# Patient Record
Sex: Female | Born: 1980 | Hispanic: Yes | Smoking: Never smoker
Health system: Southern US, Community
[De-identification: ages and names within clinical notes are randomized; demographics above are authoritative.]

## PROBLEM LIST (undated history)

## (undated) DIAGNOSIS — B999 Unspecified infectious disease: Secondary | ICD-10-CM

## (undated) HISTORY — PX: CHOLECYSTECTOMY: SHX55

## (undated) HISTORY — PX: OOPHORECTOMY: SHX86

---

## 2005-12-17 ENCOUNTER — Ambulatory Visit: Payer: Self-pay | Admitting: *Deleted

## 2006-03-18 ENCOUNTER — Ambulatory Visit: Payer: Self-pay | Admitting: Obstetrics & Gynecology

## 2006-03-18 ENCOUNTER — Encounter (INDEPENDENT_AMBULATORY_CARE_PROVIDER_SITE_OTHER): Payer: Self-pay | Admitting: Specialist

## 2006-10-26 ENCOUNTER — Ambulatory Visit: Payer: Self-pay | Admitting: Obstetrics & Gynecology

## 2006-10-26 ENCOUNTER — Encounter: Payer: Self-pay | Admitting: Obstetrics & Gynecology

## 2007-04-26 ENCOUNTER — Ambulatory Visit: Payer: Self-pay | Admitting: Obstetrics & Gynecology

## 2007-04-26 ENCOUNTER — Encounter: Payer: Self-pay | Admitting: Obstetrics & Gynecology

## 2007-05-03 ENCOUNTER — Ambulatory Visit (HOSPITAL_COMMUNITY): Admission: RE | Admit: 2007-05-03 | Discharge: 2007-05-03 | Payer: Self-pay | Admitting: Obstetrics & Gynecology

## 2007-05-10 ENCOUNTER — Ambulatory Visit: Payer: Self-pay | Admitting: Obstetrics & Gynecology

## 2007-06-09 ENCOUNTER — Ambulatory Visit: Payer: Self-pay | Admitting: Obstetrics & Gynecology

## 2007-06-09 ENCOUNTER — Encounter: Payer: Self-pay | Admitting: Obstetrics & Gynecology

## 2007-06-09 ENCOUNTER — Ambulatory Visit (HOSPITAL_COMMUNITY): Admission: RE | Admit: 2007-06-09 | Discharge: 2007-06-09 | Payer: Self-pay | Admitting: Obstetrics & Gynecology

## 2007-06-21 ENCOUNTER — Ambulatory Visit: Payer: Self-pay | Admitting: Obstetrics & Gynecology

## 2007-08-23 ENCOUNTER — Inpatient Hospital Stay (HOSPITAL_COMMUNITY): Admission: AD | Admit: 2007-08-23 | Discharge: 2007-08-23 | Payer: Self-pay | Admitting: Obstetrics and Gynecology

## 2008-04-11 ENCOUNTER — Encounter: Payer: Self-pay | Admitting: Family Medicine

## 2008-04-11 ENCOUNTER — Ambulatory Visit: Payer: Self-pay | Admitting: Obstetrics and Gynecology

## 2008-04-16 ENCOUNTER — Ambulatory Visit (HOSPITAL_COMMUNITY): Admission: RE | Admit: 2008-04-16 | Discharge: 2008-04-16 | Payer: Self-pay | Admitting: Family Medicine

## 2008-05-02 ENCOUNTER — Ambulatory Visit: Payer: Self-pay | Admitting: Obstetrics and Gynecology

## 2008-08-28 IMAGING — US US TRANSVAGINAL NON-OB
1 series · 14 of 25 positions shown · non-contrast
Comparison: None.

CLINICAL DATA: Pelvic pain.  Right adnexal mass on physical exam.
TRANSABDOMINAL AND TRANSVAGINAL PELVIC ULTRASOUND:
TECHNIQUE: Both transabdominal and transvaginal ultrasound examinations of the pelvis were performed including evaluation of the uterus, ovaries, adnexal regions, and pelvic cul-de-sac.

[Series 1: us transvaginal non-ob · 0.30mm/px · 14 of 60 slices shown]
[im 1/60]
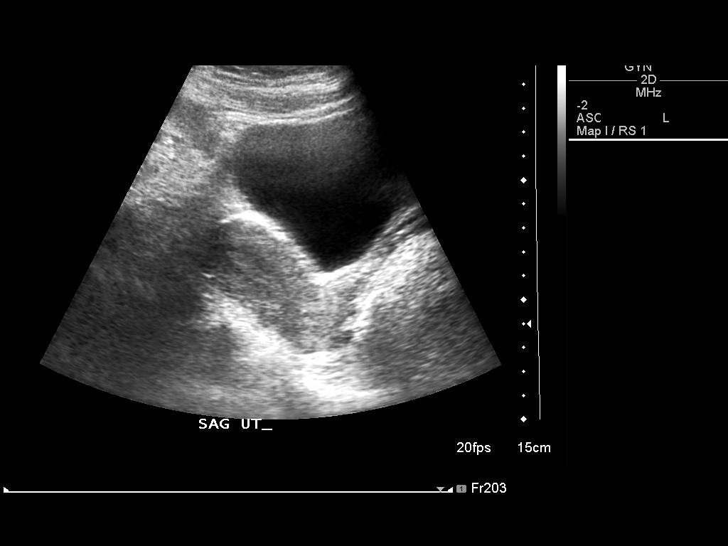
[im 5/60]
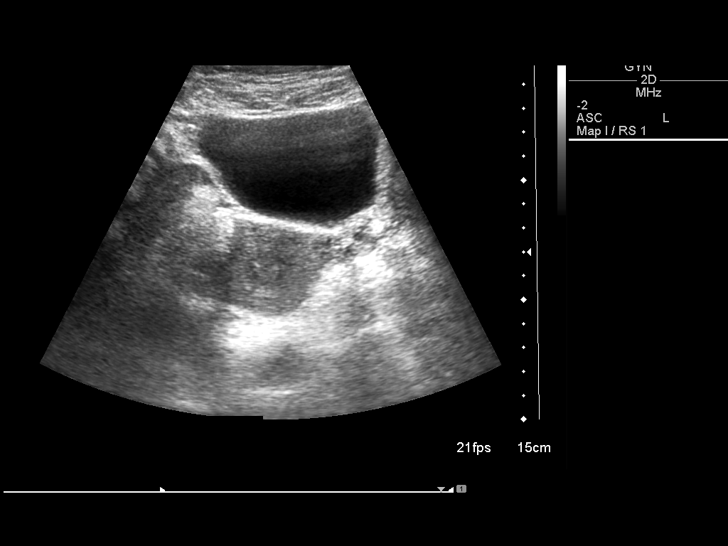
[im 10/60]
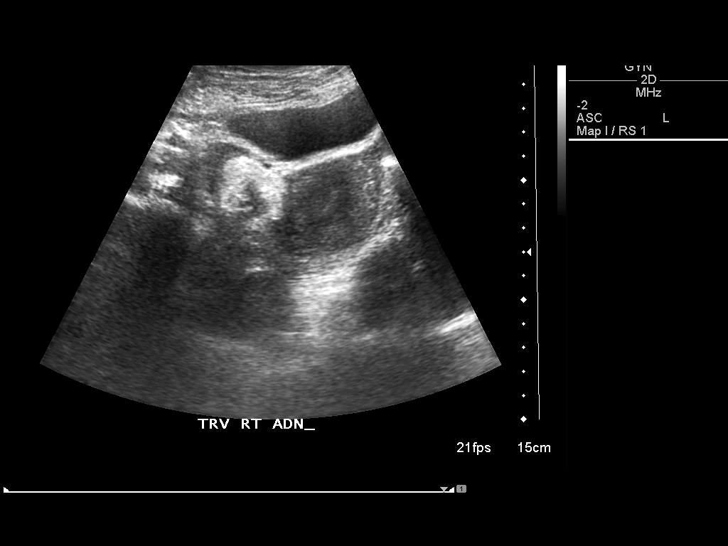
[im 15/60]
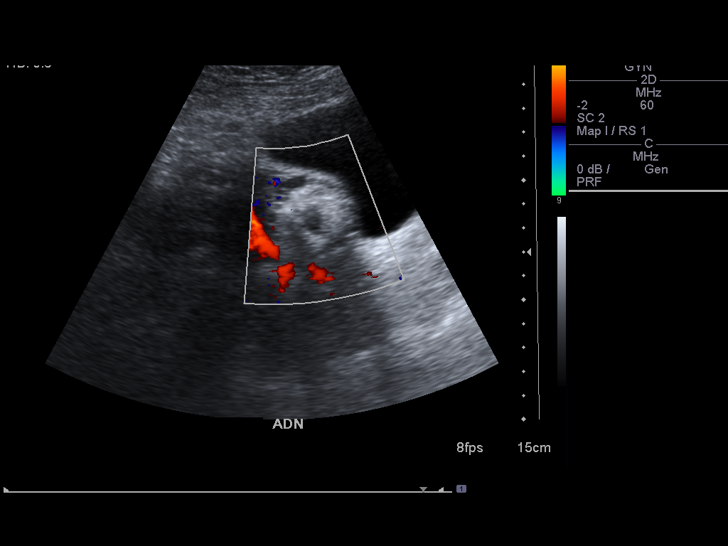
[im 20/60]
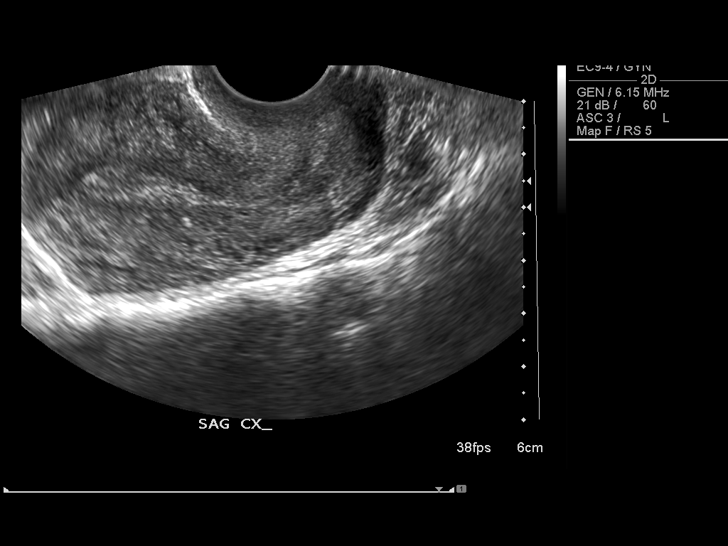
[im 23/60]
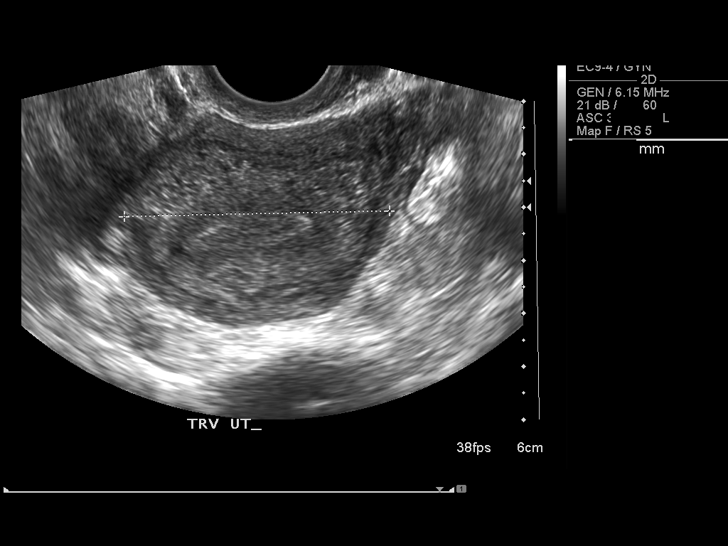
[im 28/60]
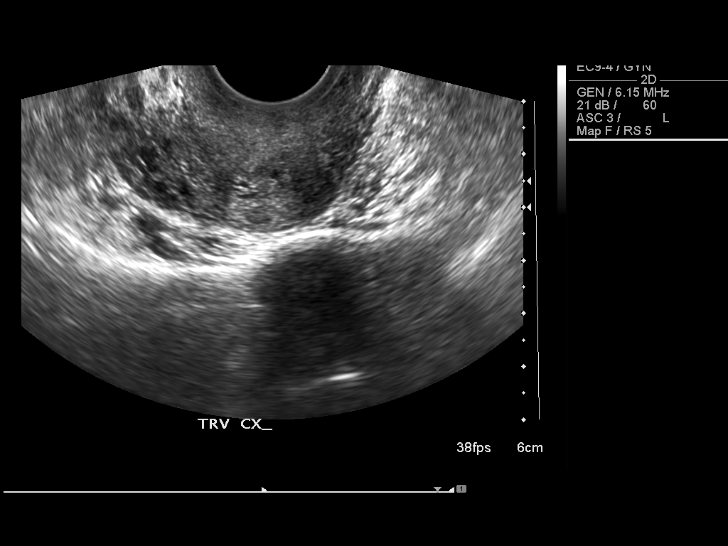
[im 32/60]
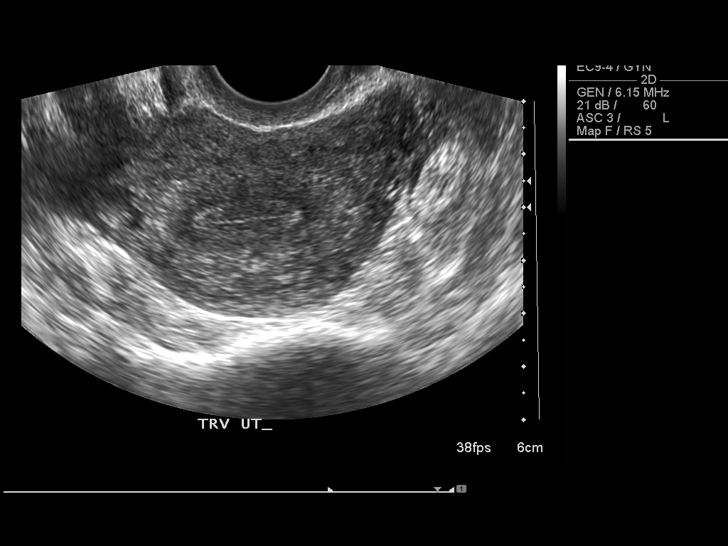
[im 37/60]
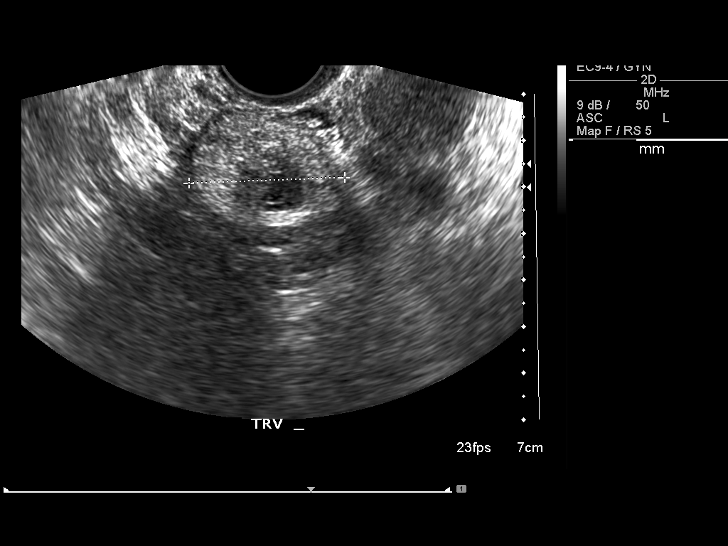
[im 40/60]
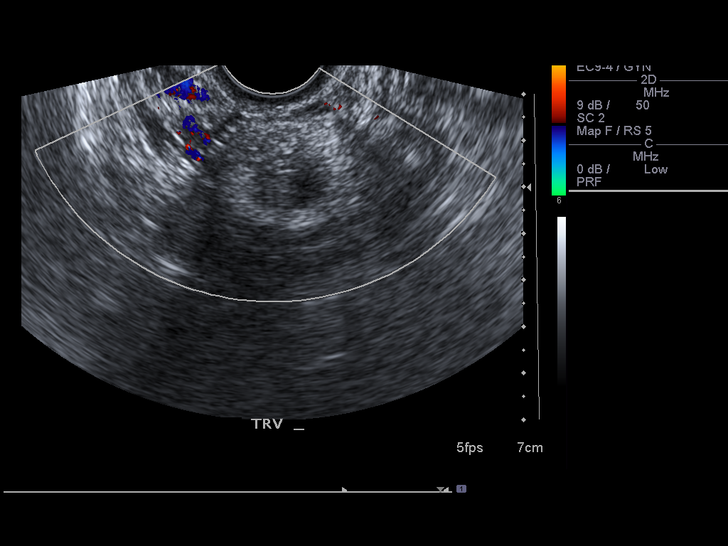
[im 45/60]
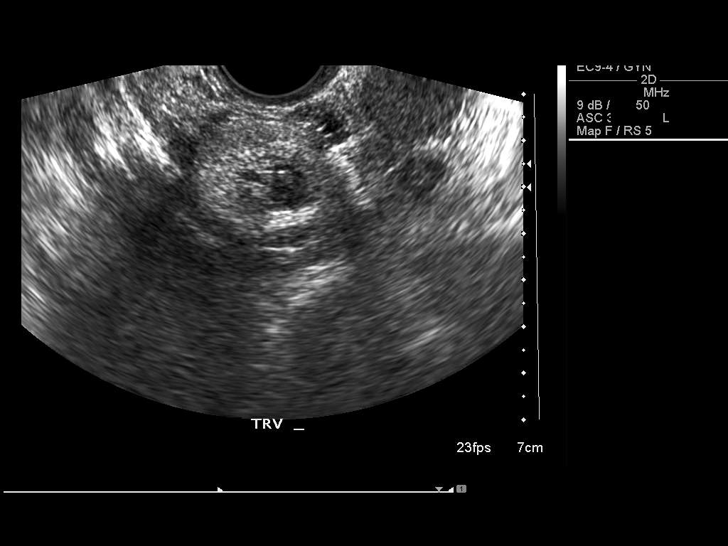
[im 50/60]
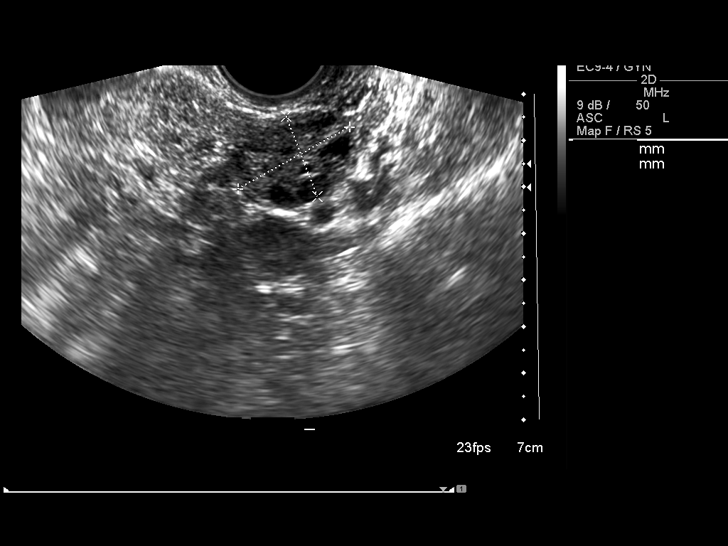
[im 55/60]
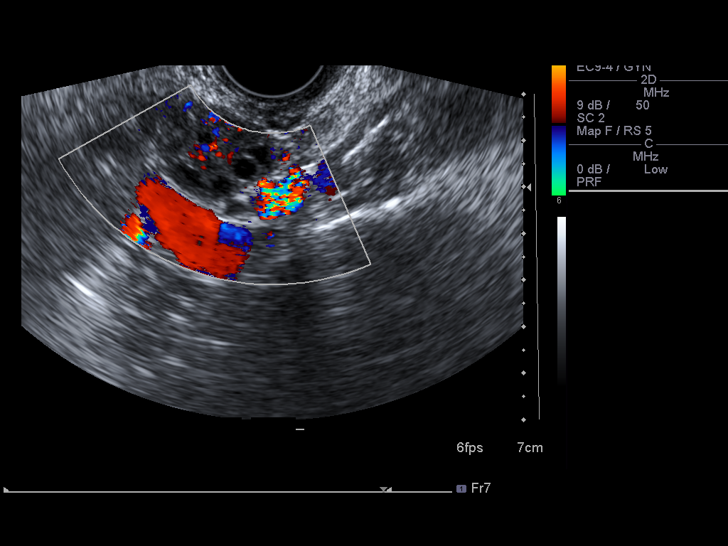
[im 60/60]
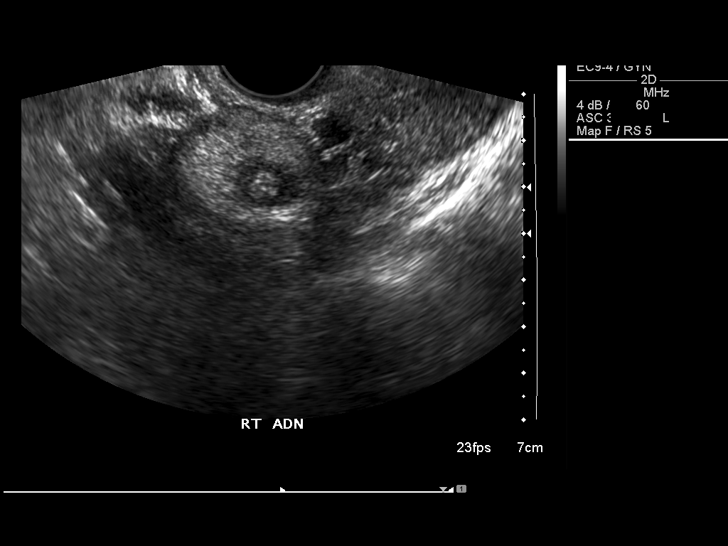

[14 of 25 positions shown; findings below may reference images not displayed]

FINDINGS: Uterus is normal in size and appearance.  There is no evidence of fibroids or other uterine abnormality.  Endometrium measures 6mm in thickness on transvaginal sonography.
The left ovary is normal in appearance.  The right ovary contains a solid and cystic mass, which is largely hyperechoic in echotexture with acoustic shadowing.  This is consistent with fat, and is consistent with a right ovarian dermoid.  This measures approximately 3.5 x 2.4 x 3.4cm.  There is no evidence of free fluid.
IMPRESSION: 1. 3.5cm predominantly solid, hyperechoic right ovarian mass, consistent with ovarian dermoid.
2. Normal uterus and left ovary.

## 2009-08-12 IMAGING — US US PELVIS COMPLETE MODIFY
1 series · 14 of 25 positions shown · non-contrast
Comparison: 05/03/2007

CLINICAL DATA: Pelvic pain.  Prior right oophrectomy for dermoid.

TRANSABDOMINAL AND TRANSVAGINAL ULTRASOUND OF PELVIS
TECHNIQUE: Both transabdominal and transvaginal ultrasound
examinations of the pelvis were performed including evaluation of
the uterus, ovaries, adnexal regions, and pelvic cul-de-sac.

[Series 1: us pelvis complete modify · 0.31mm/px · 14 of 55 slices shown]
[im 1/55]
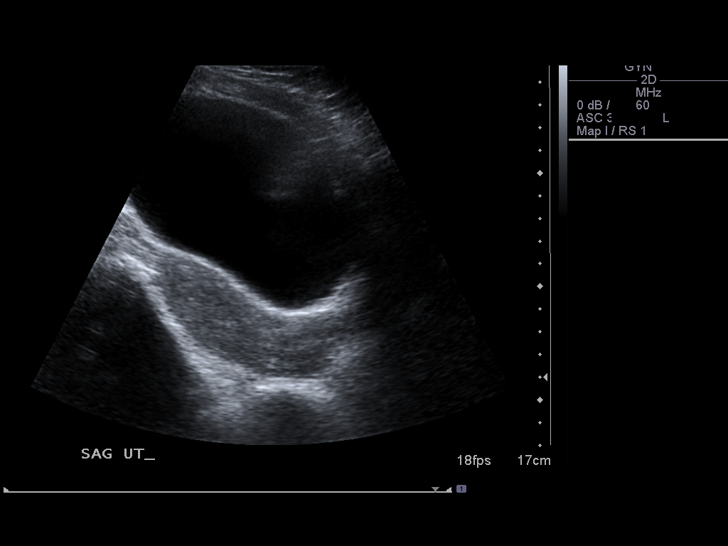
[im 5/55]
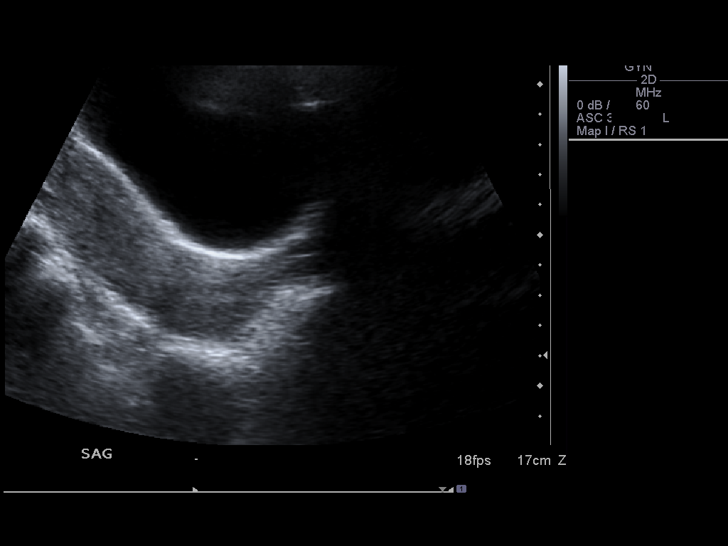
[im 10/55]
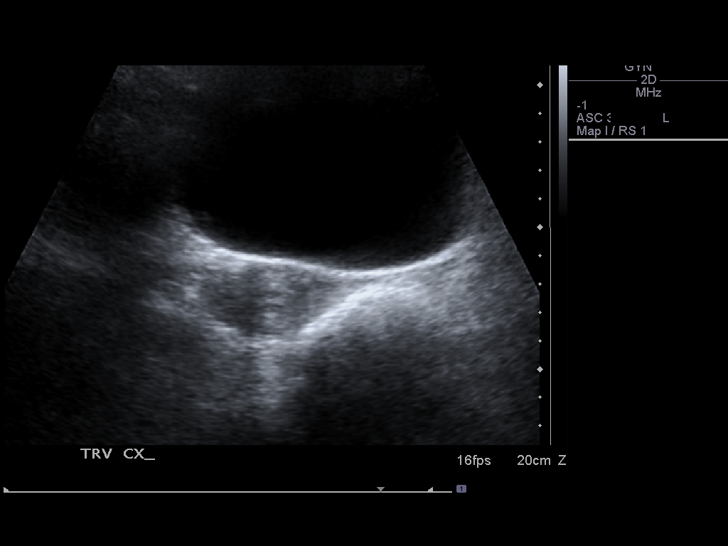
[im 14/55]
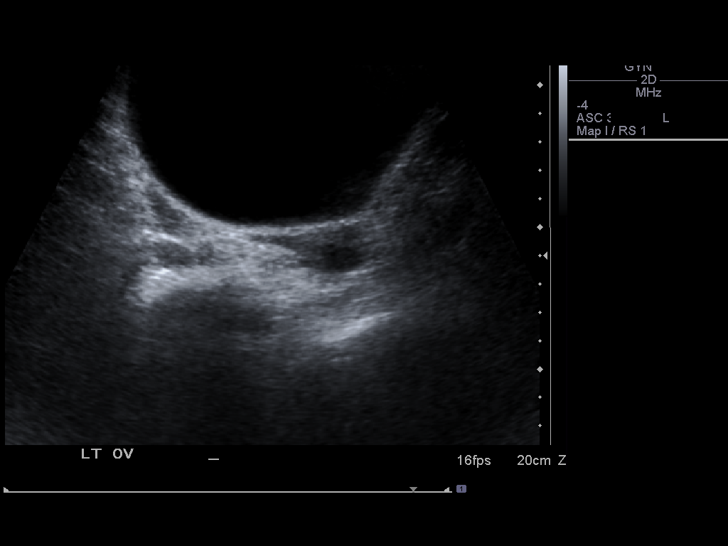
[im 19/55]
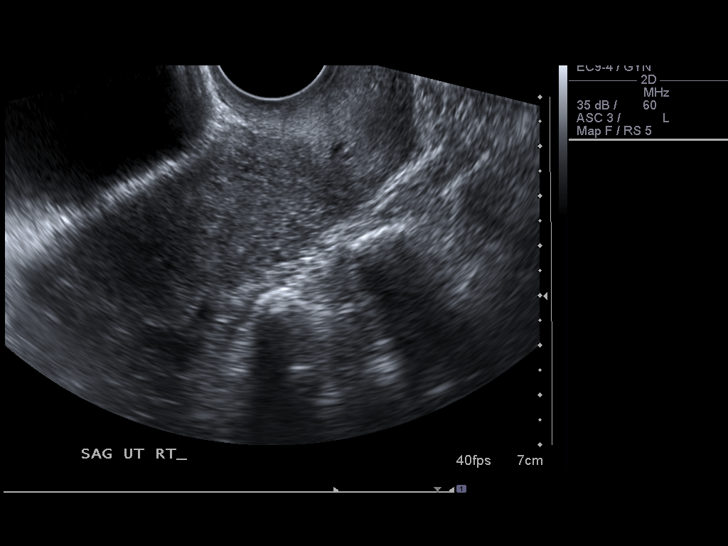
[im 21/55]
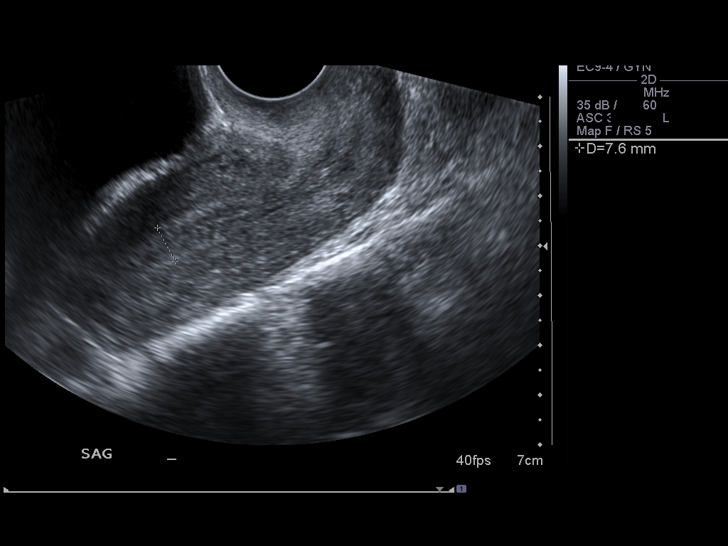
[im 25/55]
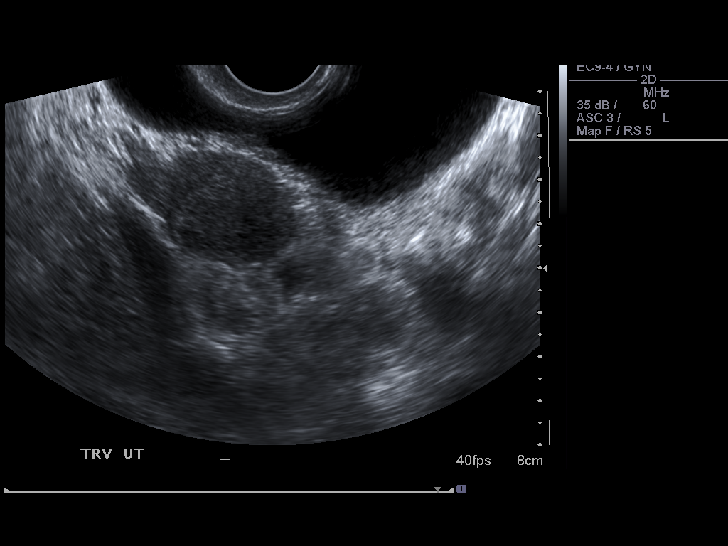
[im 30/55]
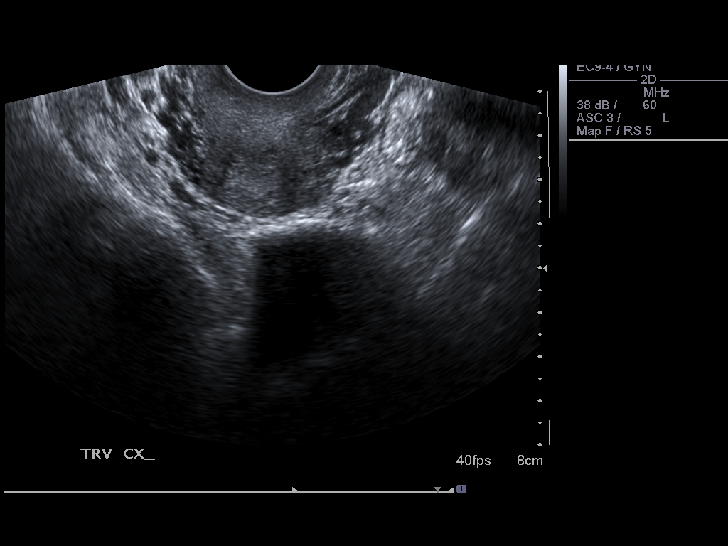
[im 34/55]
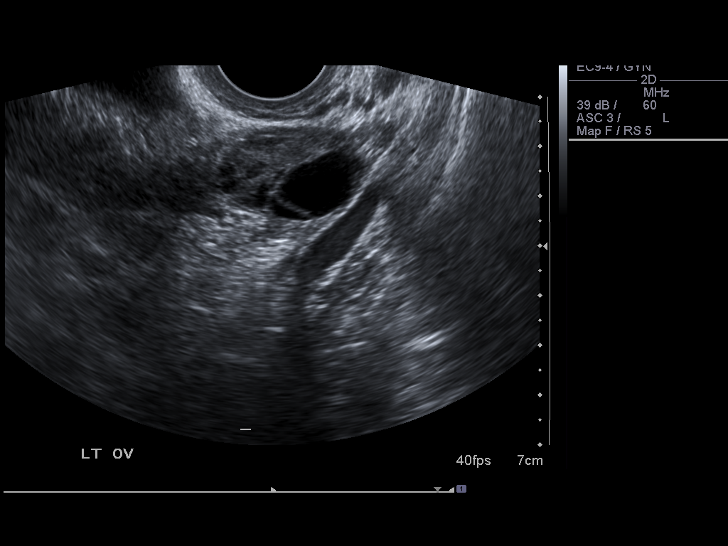
[im 37/55]
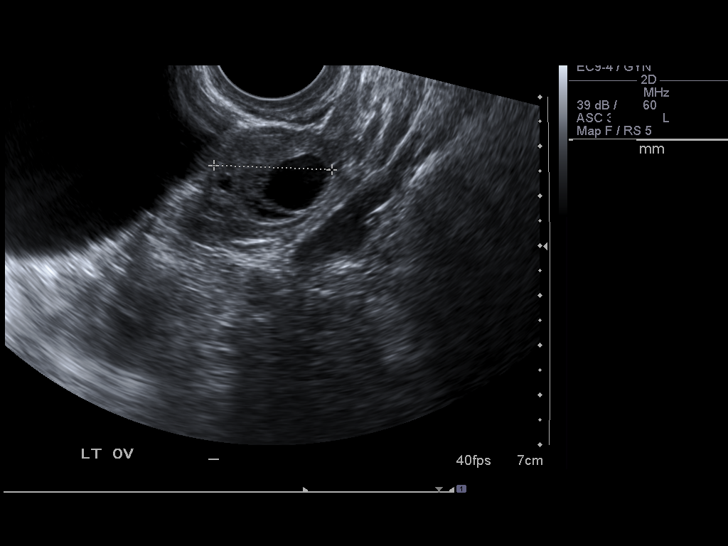
[im 41/55]
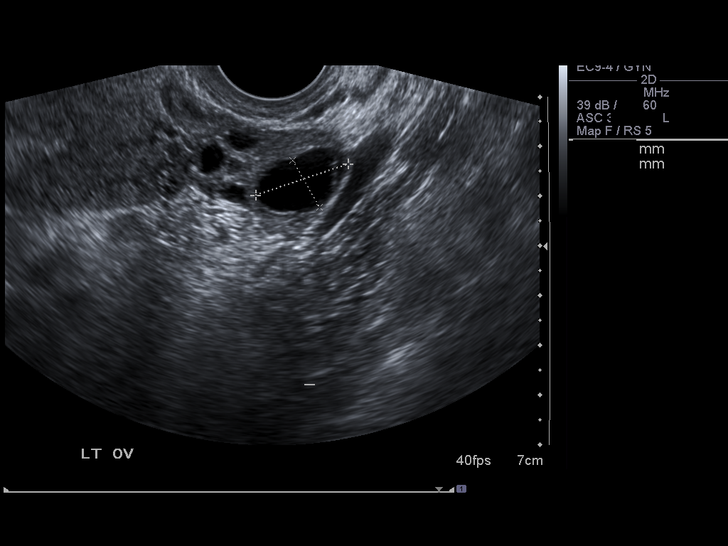
[im 46/55]
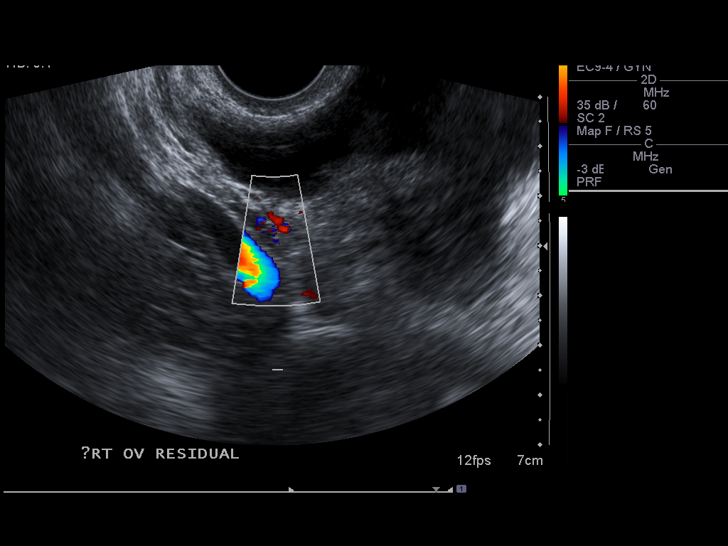
[im 50/55]
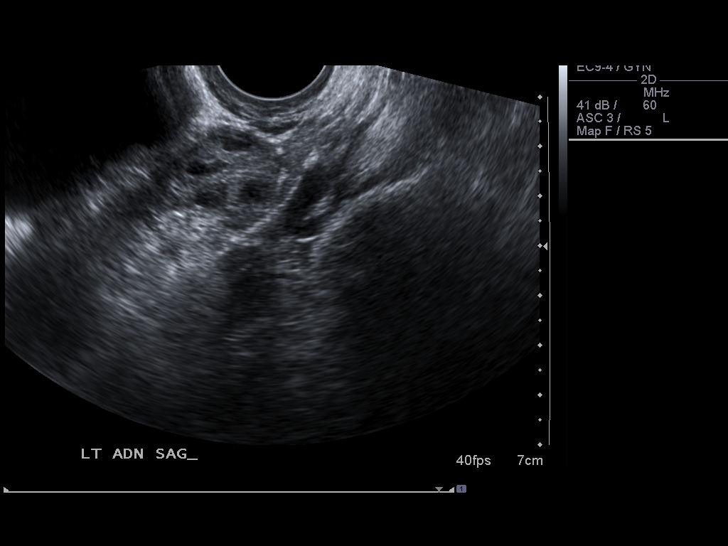
[im 55/55]
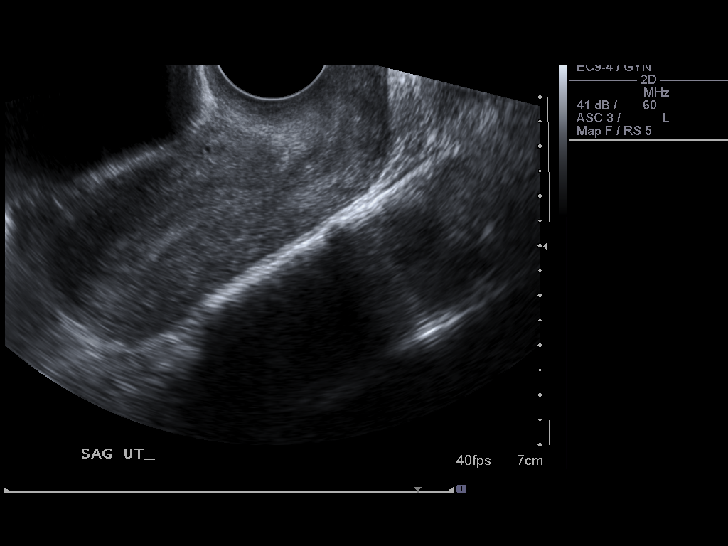

[14 of 25 positions shown; findings below may reference images not displayed]

FINDINGS: The uterus is normal in size in appearance.  No fibroids
or other uterine masses are identified.  Endometrial thickness
measures 8 mm on transvaginal sonography.

The right ovary is not directly visualized, consistent with
previous history of right oophrectomy.  The left ovary is normal
appearance.  There is no evidence of adnexal mass or free fluid.
IMPRESSION: 1.  Normal uterus and left ovary.  Prior right oophrectomy.
2.  No evidence of pelvic mass or other acute findings.

## 2011-01-05 NOTE — Group Therapy Note (Signed)
NAMEWILLETTE, [name]      ACCOUNT NO.:  [id number]   MEDICAL RECORD NO.:  [id number]          PATIENT TYPE:  WOC   LOCATION:  [facility]                   FACILITY:  [facility]   PHYSICIAN:  Johnella Moloney, MD        DATE OF BIRTH:  1981-02-28   DATE OF SERVICE:  05/10/2007                                  CLINIC NOTE   CHIEF COMPLAINT:  Followup of ultrasound results.   HISTORY OF PRESENT ILLNESS:  The patient is a 30 year old female with  last menstrual period April 21, 2007, who is here for followup of  ultrasound results.  The patient has been followed in this clinic for  cervical dysplasia, however, on her last examination was noted to have  right adnexal fullness.  As a result of this, an ultrasound was  obtained, and the ultrasound on May 03, 2007, was remarkable for a  right ovarian solid or cystic mass, which is largely hyperechoic with  acoustic shadowing consistent with fat and measuring 3.5 x 2.4 x 3.4 cm  consistent with a right ovarian dermoid.  The patient was told to make  an appointment for a discussion of these results and also for  preoperative counseling.   PAST MEDICAL HISTORY:  None.   PAST SURGICAL HISTORY:  Cryotherapy for cervical dysplasia.   MEDICATIONS:  None.   ALLERGIES:  None.   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   FAMILY MEDICAL HISTORY:  No gynecological cancer.   PHYSICAL EXAMINATION:  Her temperature is 98.9, pulse 88, blood pressure  is 95/63, weight 430 pounds, height 4 foot 10 inches.  Physical exam  deferred.   IMPRESSION:  Right ovarian dermoid.   PLAN:  The patient was counseled regarding removal of right ovarian  dermoid cyst, all questions were answered about the procedure, discussed  that this would be done through a laparoscopy and would likely be a  right ovarian cystectomy with a possibility of a oophorectomy, discussed  risk of bleeding,  infection, injuries to surrounding organ, the need for  additional procedures, also told patient that the risk of anesthesia  will be discussed with her on the day  of surgery by the anesthesiologist, all other questions were answered.  The patient will be contacted at home with further information regarding  her scheduled surgery date and time.           ______________________________  Johnella Moloney, MD     UD/MEDQ  D:  05/10/2007  T:  05/10/2007  Job:  [id number]

## 2011-01-05 NOTE — Group Therapy Note (Signed)
[name], RUSSAW      ACCOUNT NO.:  [id number]   MEDICAL RECORD NO.:  [id number]          PATIENT TYPE:  WOC   LOCATION:  [facility]                   FACILITY:  [facility]   PHYSICIAN:  Myrtie Soman, MD     DATE OF BIRTH:  1981/04/18   DATE OF SERVICE:  05/02/2008                                  CLINIC NOTE   REASON FOR OFFICE VISIT:  Discussion of results from previous abdominal  pelvic ultrasound for abdominal pain.   BACKGROUND:  The patient is a 30 year old G1, P68, who presents status  post annual exam with normal Pap smear on April 11, 2008 for discussion  of results from abdominal pelvic ultrasound that was performed pursuant  to this exam after report of abdominal pain during this examination.  Abdominal transvaginal ultrasounds had been reviewed at this office  visit.  The impression shows a normal uterus and left ovary with  evidence of a prior right oophorectomy.  There is no evidence of pelvic  mass or other acute findings, and results in general are normal.  This  was discussed with the patient, and she currently states that she is  completely asymptomatic and feels completely normal.  In addition to  discussion of results, the patient mentions that she has been trying to  get pregnant for the past 5 months.  She has a new sexual partner and is  not clear as to whether or not he has any issues of fertility himself.  20 minutes were spent counseling the patient regarding intercourse if  every time they have intercourse, so that it would coincide with  ovulation to increase her chance of the pregnancy.  Furthermore, it was  discussed that the patient should continue to trying to become pregnant,  and we would not consider starting a workup for infertility, given her  age and previous successful pregnancy until she had been without  pregnancy for 12 months.  We also counseled the patient to begin taking  prenatal vitamins as she was trying to get pregnant.  The  patient voiced  her understanding with these instructions.  Spanish interpreter was  present and interpreting throughout the entire office visit.   ASSESSMENT AND PLAN:  The patient is a 30 year old G1, P1, who presents  for discussion of results from previous abdominal pelvic ultrasound.  Results of the pelvic and abdominal ultrasounds are normal.  1. Return for annual exam and repeat Pap smear in 12 months as      previous Pap smear was normal.  2. Counseling regarding intercourse to achieve successful pregnancy      was given with encouragement to take prenatal vitamins every day as      long as she is trying to become pregnant.           ______________________________  Myrtie Soman, MD     TE/MEDQ  D:  05/02/2008  T:  05/03/2008  Job:  [id number]

## 2011-01-05 NOTE — Group Therapy Note (Signed)
NAMEMarland Kitchen  [name], [name] NO.:  [id number]   MEDICAL RECORD NO.:  [id number]          PATIENT TYPE:  WOC   LOCATION:  [facility]                   FACILITY:  [facility]   PHYSICIAN:  Argentina Donovan, MD        DATE OF BIRTH:  Jan 10, 1981   DATE OF SERVICE:  04/11/2008                                  CLINIC NOTE   CHIEF COMPLAINT:  Routine annual exam and Pap smear.   HISTORY OF PRESENT ILLNESS:  This is a 30 year old female presenting for  routine annual physical and Pap smear.  She was also complaining of  vaginal burning and itching for 2 weeks with white discharge.   PAST MEDICAL HISTORY:  Significant for:  1. A right dermoid cyst which was removed on June 08, 2008.      Pathology is reviewed today and shows only benign findings of the      right ovary and right fallopian tube.  This was a mature cystic      teratoma.  2. 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  She has had 3 negative Pap smears      since then.   PAST SURGICAL HISTORY:  1. Cryotherapy for cervical dysplasia.  2. Right BSO in October 2008.   MEDICATIONS:  None.   ALLERGIES:  No known drug allergies.   SOCIAL HISTORY:  The patient does not smoke, drink alcohol, or use  illicit drugs.   FAMILY HISTORY:  Negative for gynecological cancer.   PHYSICAL EXAM:  Temperature 98.1.  Pulse 62.  Blood pressure 110/75.  Weight 127.8 pounds.  Height 58 inches.  GENERAL:  Alert, oriented, no acute distress, pleasant, cooperative  female.  CARDIO:  Regular rate and rhythm with no murmurs, rubs, or gallops.  PULMONARY:  Clear to auscultation bilateral with normal work of  breathing.  ABDOMEN:  Normoactive bowel sounds with bilateral lower quadrant  tenderness on deep palpation but no masses, nontender on superficial  palpation.  No guarding or rebound.  BREAST EXAM:  Negative for nipple discharge, bilateral, and masses  bilateral.  Breast exam includes the axilla.  EXTREMITIES:  Nontender without edema, bilateral  lower extremities.  PELVIC EXAM:  Normal external genitalia without discharge.  Speculum  exam shows normal vaginal mucosa with rugae, light dark blood from  cervical os consistent with end of menses.  No discharge from the os.  No discharge noted within the vagina.  On bimanual exam, the patient has  no cervical motion tenderness but again has bilateral lower quadrant  tenderness on deep palpation.   ASSESSMENT AND PLAN:  This is a 30 year old female presenting for  routine annual physical exam and Pap test.  1. Routine care.  Complete physical exam including Pap test was      performed today.  Patient is not due for any other preventive care      based on her age.  2. Vaginal itching and burning.  Wet prep was obtained during pelvic      exam today.  The results are pending at this time but the symptoms      are consistent with a yeast infection.  We will treat presumptively  with fluconazole 150 mg p.o. x2 days.  When wet prep is available      tomorrow, we will change therapy if indicated.  3. Abdominal tenderness.  Given her history of abdominal surgery, we      will perform pelvic ultrasound today to evaluate further.  The      patient notes that she is having normal urinary and bowel function      at this time.     ______________________________  Romero Belling, M.D.    ______________________________  Argentina Donovan, MD    MO/MEDQ  D:  04/11/2008  T:  04/11/2008  Job:  [id number]

## 2011-01-05 NOTE — Op Note (Signed)
[name], EVERETTS      ACCOUNT NO.:  [id number]   MEDICAL RECORD NO.:  [id number]          PATIENT TYPE:  AMB   LOCATION:  [facility]                           [facility]ACILITY:  [facility]   PHYSICIAN:  Norton Blizzard, MD    DATE OF BIRTH:  1980-12-19   DATE OF PROCEDURE:  DATE OF DISCHARGE:  06/09/2007                               OPERATIVE REPORT   PREOPERATIVE DIAGNOSIS:  Chronic pelvic pain, right ovarian cyst.   POSTOPERATIVE DIAGNOSIS:  Right ovarian dermoid.   PROCEDURE:  Laparoscopic right salpingo-oophorectomy.   SURGEON:  Dr. Vela Prose Duru.   ASSISTANT:  Dr. Tinnie Gens.   ANESTHESIA:  General.   IV FLUIDS:  1800 mL of LR.   EBL:  100 mL.   INDICATIONS:  Patient is a 30 year old G1 P1 with a long history of  chronic pelvic pain.  She had an ultrasound in September, 2008, that  showed a 4 cm right ovarian dermoid cyst but normal left ovary and  uterus.  Given her dermoid cyst and chronic pain, patient was counseled  regarding laparoscopy and removal of her right ovarian dermoid.  A  Spanish interpreter was used for the preoperative counseling and written  informed consent was obtained.   FINDINGS:  1. A 4 cm right ovarian dermoid.  2. Normal left ovary, fallopian tube and uterus.  3. Normal appendix and upper abdomen.   SPECIMEN:  Right ovary and fallopian tube which was sent to pathology.   COMPLICATIONS:  None.   PROCEDURE DETAILS:  The patient was taken to the operating room where  general anesthesia was placed and found to be adequate.  She was then  placed in the dorsal lithotomy position and prepped and draped in a  sterile manner.  A uterine manipulator was placed and a red rubber  catheter was used to empty the patient's bladder.  Attention was then  turned to the patient's abdomen where a 1 cm incision was made in the  umbilical fold and a Veress needle was inserted with the CO2 gas supply  on.  Entry to the peritoneum was confirmed with an opening pressure  of 2  mmHg.  The abdomen was then insufflated to 15 mmHg.  A 10 cm trocar was  then placed and the laparoscope was inserted, which confirmed correct  placement.  Bilateral lower quadrant ports were then placed, one was 10  mm and the other one was 5 mm.  A complete survey of the pelvis and the  abdomen was then performed.  The enlarged ovary was noted, also noted a  normal left ovary, normal uterus, normal bowel, upper abdomen and normal  appendix.  No endometriosis lesions were visualized.  Attention was then  turned to the patient's right ovary where an attempt was made to try to  delineate between the normal ovarian tissue and cyst.  At this point,  this delineation was unable to be done, and given the high risk of  rupture  the decision was made to proceed with a right salpingo-  oophorectomy.  The infundibulopelvic ligament was then identified, the  ureter was noted to be safely away from the area  and this was coagulated  and ligated using the Gyrus instrument.  Subsequent coagulation and  ligation was also used free the entire right ovary and tube from the  broad and the uteroovarian ligament.  Good hemostasis was noted.  The  specimen containing the right ovary and tube was then placed in an  EndoCatch bag and safely removed from the abdomen without rupture.  The  specimen was sent to pathology for permanent section.  The pelvis was  then copiously irrigated with fluid and excellent hemostasis was  reconfirmed in all surfaces,also after the release of the  pneumoperitoneum.  All instruments were then removed from the patient's  abdomen.  The 10 mm incisions were then closed using #0 Vicryl for the  fascia, and all skin incisions were closed using #3-0 subcuticular  stitches.  The patient tolerated procedure well.  All instruments were  removed from the vagina.  At the end of the procedure, sponge, lap,  needle and instrument counts were correct x2.  She was taken to the  recovery  room in stable condition.      Norton Blizzard, MD  Electronically Signed     UAD/MEDQ  D:  06/09/2007  T:  06/11/2007  Job:  [id number]

## 2011-05-28 LAB — CBC
HCT: 39.7
Hemoglobin: 13.9
MCHC: 34.9
MCV: 87.2
RBC: 4.56

## 2011-05-28 LAB — URINALYSIS, ROUTINE W REFLEX MICROSCOPIC
Ketones, ur: NEGATIVE
Protein, ur: NEGATIVE
Specific Gravity, Urine: 1.01
pH: 7

## 2011-05-28 LAB — WET PREP, GENITAL

## 2011-06-02 LAB — CBC
Hemoglobin: 14.1
RBC: 4.71
WBC: 14.1 — ABNORMAL HIGH

## 2014-09-19 ENCOUNTER — Inpatient Hospital Stay (HOSPITAL_COMMUNITY)
Admission: AD | Admit: 2014-09-19 | Discharge: 2014-09-19 | Disposition: A | Discharge status: Home / Self Care | Payer: Self-pay | Source: Ambulatory Visit | Attending: Obstetrics & Gynecology | Admitting: Obstetrics & Gynecology

## 2014-09-19 ENCOUNTER — Inpatient Hospital Stay (HOSPITAL_COMMUNITY): Payer: Self-pay

## 2014-09-19 ENCOUNTER — Encounter (HOSPITAL_COMMUNITY): Payer: Self-pay | Admitting: *Deleted

## 2014-09-19 DIAGNOSIS — R1031 Right lower quadrant pain: Secondary | ICD-10-CM

## 2014-09-19 DIAGNOSIS — R300 Dysuria: Secondary | ICD-10-CM | POA: Insufficient documentation

## 2014-09-19 DIAGNOSIS — R1032 Left lower quadrant pain: Secondary | ICD-10-CM

## 2014-09-19 DIAGNOSIS — N949 Unspecified condition associated with female genital organs and menstrual cycle: Secondary | ICD-10-CM | POA: Insufficient documentation

## 2014-09-19 DIAGNOSIS — Z3202 Encounter for pregnancy test, result negative: Secondary | ICD-10-CM | POA: Insufficient documentation

## 2014-09-19 DIAGNOSIS — R109 Unspecified abdominal pain: Secondary | ICD-10-CM | POA: Insufficient documentation

## 2014-09-19 HISTORY — DX: Unspecified infectious disease: B99.9

## 2014-09-19 LAB — URINALYSIS, ROUTINE W REFLEX MICROSCOPIC
Bilirubin Urine: NEGATIVE
Glucose, UA: NEGATIVE mg/dL
HGB URINE DIPSTICK: NEGATIVE
KETONES UR: NEGATIVE mg/dL
Leukocytes, UA: NEGATIVE
Nitrite: NEGATIVE
PH: 5.5 (ref 5.0–8.0)
PROTEIN: NEGATIVE mg/dL
Specific Gravity, Urine: 1.015 (ref 1.005–1.030)
Urobilinogen, UA: 0.2 mg/dL (ref 0.0–1.0)

## 2014-09-19 LAB — CBC
HEMATOCRIT: 40.7 % (ref 36.0–46.0)
Hemoglobin: 14.1 g/dL (ref 12.0–15.0)
MCH: 29.7 pg (ref 26.0–34.0)
MCHC: 34.6 g/dL (ref 30.0–36.0)
MCV: 85.7 fL (ref 78.0–100.0)
PLATELETS: 286 10*3/uL (ref 150–400)
RBC: 4.75 MIL/uL (ref 3.87–5.11)
RDW: 12.7 % (ref 11.5–15.5)
WBC: 7.8 10*3/uL (ref 4.0–10.5)

## 2014-09-19 LAB — WET PREP, GENITAL
CLUE CELLS WET PREP: NONE SEEN
TRICH WET PREP: NONE SEEN
Yeast Wet Prep HPF POC: NONE SEEN

## 2014-09-19 LAB — POCT PREGNANCY, URINE: Preg Test, Ur: NEGATIVE

## 2014-09-19 NOTE — MAU Provider Note (Signed)
History     CSN: [id number]  Arrival date and time: 09/19/14 4098   None     Chief Complaint  Patient presents with  . Dysuria   The history is provided by the patient. The history is limited by a language barrier. A language interpreter was used.   Assisted by [facility].   [name] is a 34 y.o. G2P0200 who presents to the MAU with complaint of dysuria for the past 2 months, and abdominal pain x for the past 2 weeks. Abdominal pain is 6/10, constant, localized to lower abdomen and suprapubic. Denies any hematuria, frequency or urgency, back pain, vaginal discharge or bleeding, fever, chills, malaise, nausea, vomiting, diarrhea or constipation. LMP 08/21/2015. No treatment at home. Not concerned for STI exposure. One UTI in the past, some years ago, this episode does not feel similar.    OB History    Gravida Para Term Preterm AB TAB SAB Ectopic Multiple Living   2 2 0 2            Past Medical History  Diagnosis Date  . Infection     UTI    Past Surgical History  Procedure Laterality Date  . Oophorectomy    . Cholecystectomy      Family History  Problem Relation Age of Onset  . Hypertension Brother   . Heart disease Brother     History  Substance Use Topics  . Smoking status: Never Smoker   . Smokeless tobacco: Never Used  . Alcohol Use: No    Allergies: No Known Allergies  Prescriptions prior to admission  Medication Sig Dispense Refill Last Dose  . acetaminophen (TYLENOL) 500 MG tablet Take 1,000 mg by mouth every 6 (six) hours as needed for mild pain.   09/18/2014 at Unknown time    ROS  Pertinent positives and negatives are noted in HPI.  Physical Exam   Blood pressure 124/73, pulse 62, temperature 98.1 F (36.7 C), temperature source Oral, resp. rate 16, height  (1.473 m), weight 135 lb (61.236 kg), last menstrual period 09/10/2014.  Physical Exam  Constitutional: She is oriented to person, place, and time. She appears  well-developed and well-nourished. No distress.  HENT:  Head: Normocephalic and atraumatic.  Cardiovascular: Normal rate.   Respiratory: Effort normal. No respiratory distress.  GI: Soft. Bowel sounds are normal. She exhibits no distension and no mass. There is tenderness (marked tenderness on bilateral lower abdomen and suprapubic). There is rebound (midline) and guarding (LLQ>RLQ and midline). There is no CVA tenderness.  Genitourinary: Uterus normal. There is no rash, tenderness, lesion or injury on the right labia. There is no rash, tenderness, lesion or injury on the left labia. Cervix exhibits discharge (thinn clear liquid discharge from the os). Cervix exhibits no motion tenderness and no friability. Right adnexum displays no mass, no tenderness and no fullness. Left adnexum displays no mass, no tenderness and no fullness. No erythema, tenderness or bleeding in the vagina. No foreign body around the vagina. No signs of injury around the vagina. Vaginal discharge (thin yellowish liquid discharge, moderate amount) found.  Neurological: She is alert and oriented to person, place, and time.  Skin: Skin is warm and dry.      MAU Course  Procedures  MDM  UPT negative No evidence of UTI on the UA Patient's description of abdominal pain concerning with reported pain RLQ>LLQ, although on PE tenderness on palpation equal bilaterally.   CBC and wet prep normal.  During  the MAU stay, patient reported marked improvement in abdominal pain, which was evident on repeat abdominal PE.   CBC and SONO unremarkable.   Results for orders placed or performed during the hospital encounter of 09/19/14 (from the past 24 hour(s))  Urinalysis, Routine w reflex microscopic     Status: None   Collection Time: 09/19/14  8:45 AM  Result Value Ref Range   Color, Urine YELLOW YELLOW   APPearance CLEAR CLEAR   Specific Gravity, Urine 1.015 1.005 - 1.030   pH 5.5 5.0 - 8.0   Glucose, UA NEGATIVE NEGATIVE mg/dL    Hgb urine dipstick NEGATIVE NEGATIVE   Bilirubin Urine NEGATIVE NEGATIVE   Ketones, ur NEGATIVE NEGATIVE mg/dL   Protein, ur NEGATIVE NEGATIVE mg/dL   Urobilinogen, UA 0.2 0.0 - 1.0 mg/dL   Nitrite NEGATIVE NEGATIVE   Leukocytes, UA NEGATIVE NEGATIVE  Pregnancy, urine POC     Status: None   Collection Time: 09/19/14  8:53 AM  Result Value Ref Range   Preg Test, Ur NEGATIVE NEGATIVE  Wet prep, genital     Status: Abnormal   Collection Time: 09/19/14 10:00 AM  Result Value Ref Range   Yeast Wet Prep HPF POC NONE SEEN NONE SEEN   Trich, Wet Prep NONE SEEN NONE SEEN   Clue Cells Wet Prep HPF POC NONE SEEN NONE SEEN   WBC, Wet Prep HPF POC FEW (A) NONE SEEN  CBC     Status: None   Collection Time: 09/19/14 11:06 AM  Result Value Ref Range   WBC 7.8 4.0 - 10.5 K/uL   RBC 4.75 3.87 - 5.11 MIL/uL   Hemoglobin 14.1 12.0 - 15.0 g/dL   HCT 16.1 09.6 - 04.5 %   MCV 85.7 78.0 - 100.0 fL   MCH 29.7 26.0 - 34.0 pg   MCHC 34.6 30.0 - 36.0 g/dL   RDW 40.9 81.1 - 91.4 %   Platelets 286 150 - 400 K/uL    US Transvaginal Non-ob  09/19/2014   CLINICAL DATA:  Two-month history of lower pelvic pain. Patient is status post right oophorectomy  EXAM: TRANSABDOMINAL AND TRANSVAGINAL ULTRASOUND OF PELVIS  DOPPLER ULTRASOUND OF OVARIES  TECHNIQUE: Study was performed transabdominally to optimize pelvic field of view evaluation and transvaginally to optimize internal visceral architecture evaluation.  Color and duplex Doppler ultrasound was utilized to evaluate blood flow to the left ovary.  COMPARISON:  April 16, 2008  FINDINGS: Uterus  Measurements: 8.0 x 4.4 x 5.4 cm. No fibroids or other mass visualized.  Endometrium  Thickness: 10 mm.  No focal abnormality visualized.  Right ovary  Surgically absent. There is no right-sided extrauterine pelvic or adnexal mass.  Left ovary  Measurements: 2.7 x 2.1 x 3.4 cm. Normal appearance/no adnexal mass.  Pulsed Doppler evaluation of the left ovary demonstrates normal  low-resistance arterial and venous waveforms. Peak systolic velocity for the left ovary measures 18 cm/sec.  Other findings  No free fluid.  IMPRESSION: Status post right oophorectomy. Study otherwise unremarkable. No evidence of left ovarian torsion. No intrauterine or extrauterine masses. No free fluid apparent.   Electronically Signed   By: Bretta Bang M.D.   On: 09/19/2014 12:56   US Pelvis Complete  09/19/2014   CLINICAL DATA:  Two-month history of lower pelvic pain. Patient is status post right oophorectomy  EXAM: TRANSABDOMINAL AND TRANSVAGINAL ULTRASOUND OF PELVIS  DOPPLER ULTRASOUND OF OVARIES  TECHNIQUE: Study was performed transabdominally to optimize pelvic field of view evaluation and transvaginally  to optimize internal visceral architecture evaluation.  Color and duplex Doppler ultrasound was utilized to evaluate blood flow to the left ovary.  COMPARISON:  April 16, 2008  FINDINGS: Uterus  Measurements: 8.0 x 4.4 x 5.4 cm. No fibroids or other mass visualized.  Endometrium  Thickness: 10 mm.  No focal abnormality visualized.  Right ovary  Surgically absent. There is no right-sided extrauterine pelvic or adnexal mass.  Left ovary  Measurements: 2.7 x 2.1 x 3.4 cm. Normal appearance/no adnexal mass.  Pulsed Doppler evaluation of the left ovary demonstrates normal low-resistance arterial and venous waveforms. Peak systolic velocity for the left ovary measures 18 cm/sec.  Other findings  No free fluid.  IMPRESSION: Status post right oophorectomy. Study otherwise unremarkable. No evidence of left ovarian torsion. No intrauterine or extrauterine masses. No free fluid apparent.   Electronically Signed   By: Bretta BangWilliam  Woodruff M.D.   On: 09/19/2014 12:56   [place] Art/ven Flow Abd Pelv Doppler  09/19/2014   CLINICAL DATA:  Two-month history of lower pelvic pain. Patient is status post right oophorectomy  EXAM: TRANSABDOMINAL AND TRANSVAGINAL ULTRASOUND OF PELVIS  DOPPLER ULTRASOUND OF OVARIES   TECHNIQUE: Study was performed transabdominally to optimize pelvic field of view evaluation and transvaginally to optimize internal visceral architecture evaluation.  Color and duplex Doppler ultrasound was utilized to evaluate blood flow to the left ovary.  COMPARISON:  April 16, 2008  FINDINGS: Uterus  Measurements: 8.0 x 4.4 x 5.4 cm. No fibroids or other mass visualized.  Endometrium  Thickness: 10 mm.  No focal abnormality visualized.  Right ovary  Surgically absent. There is no right-sided extrauterine pelvic or adnexal mass.  Left ovary  Measurements: 2.7 x 2.1 x 3.4 cm. Normal appearance/no adnexal mass.  Pulsed Doppler evaluation of the left ovary demonstrates normal low-resistance arterial and venous waveforms. Peak systolic velocity for the left ovary measures 18 cm/sec.  Other findings  No free fluid.  IMPRESSION: Status post right oophorectomy. Study otherwise unremarkable. No evidence of left ovarian torsion. No intrauterine or extrauterine masses. No free fluid apparent.   Electronically Signed   By: Bretta BangWilliam  Woodruff M.D.   On: 09/19/2014 12:56     Assessment and Plan  1. Abdominal pain  Plan:  Discharge home.  STI testing pending, will call if results positive to initiate treatment.  Take ibuprofen or acetaminophen for abdominal pain.  F/U with PCP if pain does not improve or resolve.  Return to MAU as needed.   Misior, Marlana Salvagenna M 09/19/2014, 1:24 PM   I have seen this patient and agree with the above PA student's note.  I personally examined this pt and on abdominal exam she exhibited mild bilateral abdominal tenderness with no rebound tenderness or guarding on my exam.  LEFTWICH-KIRBY, Roy Snuffer Certified Nurse-Midwife

## 2014-09-19 NOTE — Discharge Instructions (Signed)
Dolor abdominal en las mujeres °(Abdominal Pain, Women) °El dolor abdominal (en el estómago, la pelvis o el vientre) puede tener muchas causas. Es importante que le informe a su médico: °· La ubicación del dolor. °· ¿Viene y se va, o persiste todo el tiempo? °· ¿Hay situaciones que inician el dolor (comer ciertos alimentos, la actividad física)? °· ¿Tiene otros síntomas asociados al dolor (fiebre, náuseas, vómitos, diarrea)? °Todo es de gran ayuda cuando se trata de hallar la causa del dolor. °CAUSAS °· Estómago: Infecciones por virus o bacterias, o úlcera. °· Intestino: Apendicitis (apéndice inflamado), ileitis regional (enfermedad de Crohn), colitis ulcerosa (colon inflamado), síndrome del colon irritable, diverticulitis (inflamación de los divertículos del colon) o cáncer de estómago oo intestino. °· Enfermedades de la vesícula biliar o cálculos. °· Enfermedades renales, cálculos o infecciones en el riñón. °· Infección o cáncer del páncreas. °· Fibromialgia (trastorno doloroso) °· Enfermedades de los órganos femeninos: °¨ Uterus: Útero: fibroma (tumor no canceroso) o infección °¨ Trompas de Falopio: infección o embarazo ectópico °¨ En los ovarios, quistes o tumores. °¨ Adherencias pélvicas (tejido cicatrizal). °¨ Endometriosis (el tejido que cubre el útero se desarrolla en la pelvis y los órganos pélvicos). °¨ Síndrome de congestión pélvica (los órganos femeninos se llenan de sangre antes del periodo menstrual( °¨ Dolor durante el periodo menstrual. °¨ Dolor durante la ovulación (al producir óvulos). °¨ Dolor al usar el DIU (dispositivo intrauterino para el control de la natalidad) °¨ Cáncer en los órganos femeninos. °· Dolor funcional (no está originado en una enfermedad, puede mejorar sin tratamiento). °· Dolor de origen psicológico °· Depresión. °DIAGNÓSTICO °Su médico decidirá la gravedad del dolor a través del examen físico °· Análisis de sangre °· Radiografías °· Ecografías °· TC (tomografía computada, tipo  especial de radiografías). °· IMR (resonancia magnética) °· Cultivos, en el caso una infección °· Colon por enema de bario (se inserta una sustancia de contraste en el intestino grueso para mejorar la observación con rayos X.) °· Colonoscopía (observación del intestino con un tubo luminoso). °· Laparoscopía (examen del interior del abdomen con un tubo que tiene una luz). °· Cirugía exploratoria abdominal mayor (se observa el abdomen realizando una gran incisión). °TRATAMIENTO °El tratamiento dependerá de la causa del problema.  °· Muchos de estos casos pueden controlarse y tratarse en casa. °· Medicamentos de venta libre indicados por el médico. °· Medicamentos con receta. °· Antibióticos, en caso de infección °· Píldoras anticonceptivas, en el caso de períodos dolorosos o dolor al ovular. °· Tratamiento hormonal, para la endometriosis °· Inyecciones para bloqueo nervioso selectivo. °· Fisioterapia. °· Antidepresivos. °· Consejos por parte de un psícólogo o psiquiatra. °· Cirugía mayor o menor. °INSTRUCCIONES PARA EL CUIDADO DOMICILIARIO °· No tome ni administre laxantes a menos que se lo haya indicado su médico. °· Tome analgésicos de venta libre sólo si se lo ha indicado el profesional que lo asiste. No tome aspirina, ya que puede causar molestias en el estómago o hemorragias. °· Consuma una dieta líquida (caldo o agua) según lo indicado por el médico. Progrese lentamente a una dieta blanda, según la tolerancia, si el dolor se relaciona con el estómago o el intestino. °· Tenga un termómetro y tómese la temperatura varias veces al día. °· Haga reposo en la cama y duerma, si esto alivia el dolor. °· Evite las relaciones sexuales, si le producen dolor. °· Evite las situaciones estresantes. °· Cumpla con las visitas y los análisis de control, según las indicaciones de su médico. °· Si el dolor   no se alivia con los medicamentos o la cirugía, puede tratar con: °¨ Acupuntura. °¨ Ejercicios de relajación (yoga,  meditación). °¨ Terapia grupal. °¨ Psicoterapia. °SOLICITE ATENCIÓN MÉDICA SI: °· Nota que ciertos alimentos le producen dolor de estómago. °· El tratamiento indicado para realizar en el hogar no le alivia el dolor. °· Necesita analgésicos más fuertes. °· Quiere que le retiren el DIU. °· Si se siente confundido o desfalleciente. °· Presenta náuseas o vómitos. °· Aparece una erupción cutánea. °· Sufre efectos adversos o una reacción alérgica debido a los medicamentos que toma. °SOLICITE ATENCIÓN MÉDICA DE INMEDIATO SI: °· El dolor persiste o se agrava. °· Tiene fiebre. °· Siente el dolor sólo en algunos sectores del abdomen. Si se localiza en la zona derecha, posiblemente podría tratarse de apendicitis. En un adulto, si se localiza en la región inferior izquierda del abdomen, podría tratarse de colitis o diverticulitis. °· Hay sangre en las heces (deposiciones de color rojo brillante o negro alquitranado), con o sin vómitos. °· Usted presenta sangre en la orina. °· Siente escalofríos con o sin fiebre. °· Se desmaya. °ASEGÚRESE QUE:  °· Comprende estas instrucciones. °· Controlará su enfermedad. °· Solicitará ayuda de inmediato si no mejora o si empeora. °Document Released: 11/25/2008 Document Revised: 11/01/2011 °ExitCare® Patient Information ©2015 [employer]. This information is not intended to replace advice given to you by your health care provider. Make sure you discuss any questions you have with your health care provider. ° °

## 2014-09-19 NOTE — MAU Note (Addendum)
Burning with urination.  Vaginal itching.  Going on for 2 months. Now starting to have some abd pain.had the pain 2 months ago, went away, now is back

## 2014-09-19 NOTE — Progress Notes (Signed)
I assisted Tobi Bastosnna PA Student with questions and I was present during the exam. Eda H Royal Interpreter.

## 2014-09-20 LAB — URINE CULTURE
COLONY COUNT: NO GROWTH
Culture: NO GROWTH

## 2014-09-20 LAB — GC/CHLAMYDIA PROBE AMP (~~LOC~~) NOT AT ARMC
CHLAMYDIA, DNA PROBE: NEGATIVE
NEISSERIA GONORRHEA: NEGATIVE

## 2016-08-02 ENCOUNTER — Other Ambulatory Visit: Payer: Self-pay | Admitting: Sports Medicine

## 2016-08-02 DIAGNOSIS — G8929 Other chronic pain: Secondary | ICD-10-CM

## 2016-08-02 DIAGNOSIS — M25571 Pain in right ankle and joints of right foot: Principal | ICD-10-CM

## 2016-08-07 ENCOUNTER — Ambulatory Visit
Admission: RE | Admit: 2016-08-07 | Discharge: 2016-08-07 | Disposition: A | Discharge status: Home / Self Care | Payer: Worker's Compensation | Source: Ambulatory Visit | Attending: Sports Medicine | Admitting: Sports Medicine

## 2016-08-07 DIAGNOSIS — M25571 Pain in right ankle and joints of right foot: Principal | ICD-10-CM

## 2016-08-07 DIAGNOSIS — G8929 Other chronic pain: Secondary | ICD-10-CM

## 2017-12-03 IMAGING — MR MR ANKLE*R* W/O CM
4 of 6 series · 22 of 40 positions shown · non-contrast
Comparison: None.

CLINICAL DATA: Right ankle pain. Pain for 9 months. Ran over by a
forklift.

EXAM:
MRI OF THE RIGHT ANKLE WITHOUT CONTRAST
TECHNIQUE: Multiplanar, multisequence MR imaging of the ankle was performed. No
intravenous contrast was administered.

[Series 3: PD fat-sat · axial · 3.5mm · 0.31mm/px · z∈[-65,+36]mm · 6 of 24 slices shown]
[im 1/24]
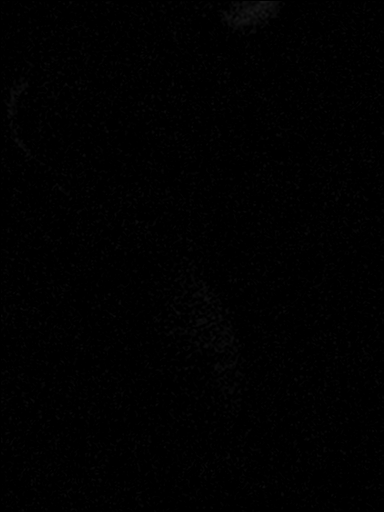
[im 5/24]
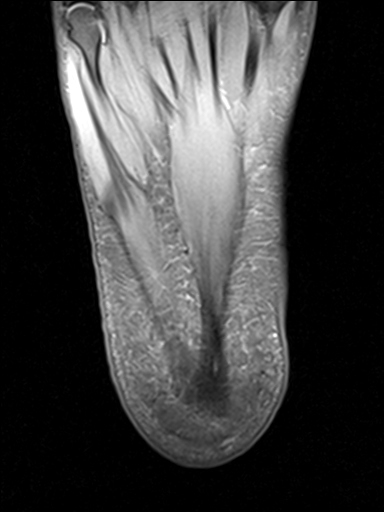
[im 10/24]
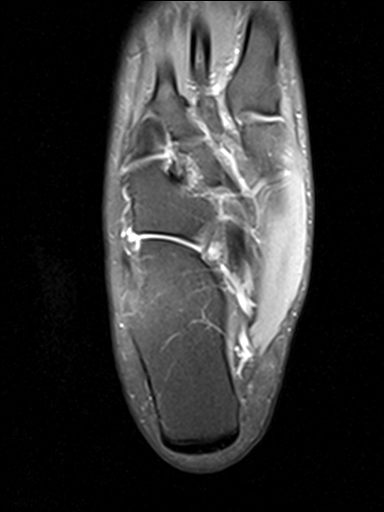
[im 14/24]
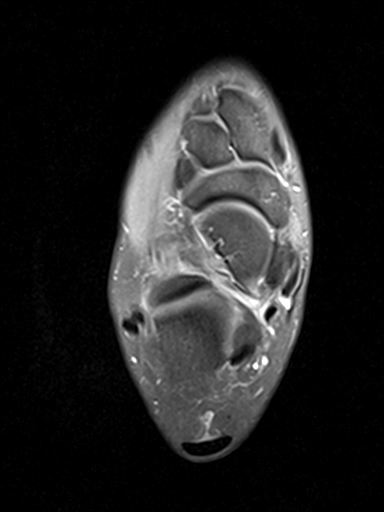
[im 19/24]
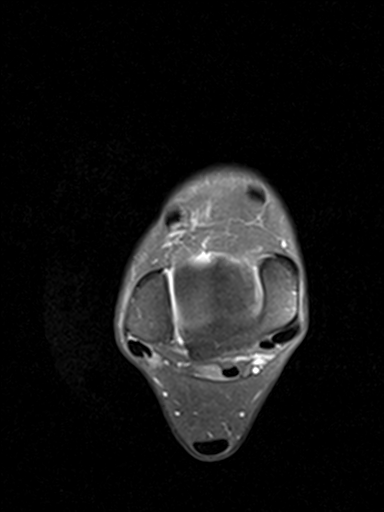
[im 24/24]
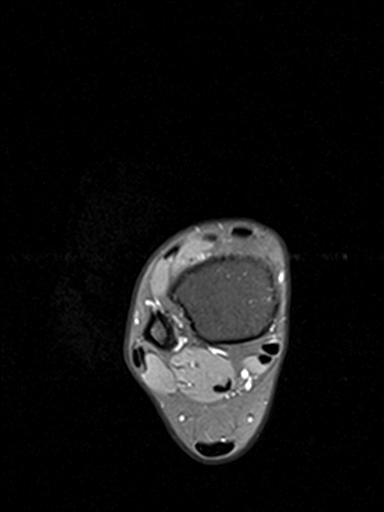

[Series 4: T2 fat-sat · axial · 3.5mm · 0.31mm/px · z∈[-65,+36]mm · 7 of 24 slices shown (1 of 3)]
[im 1/24]
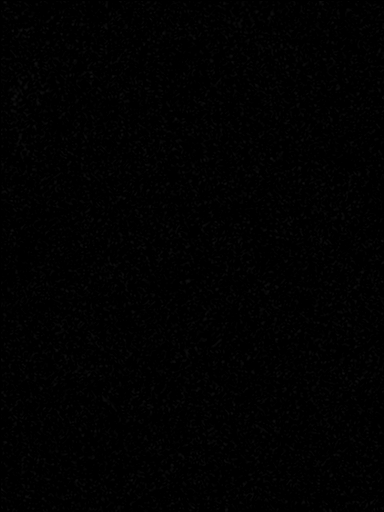
[im 4/24]
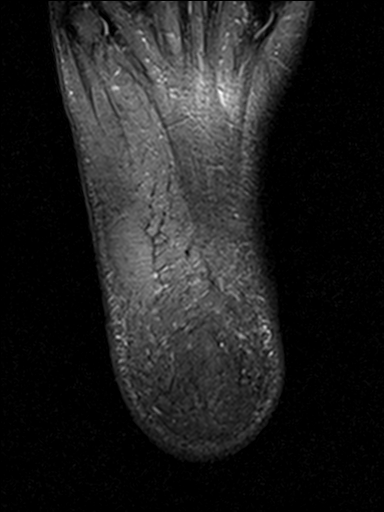
[im 8/24]
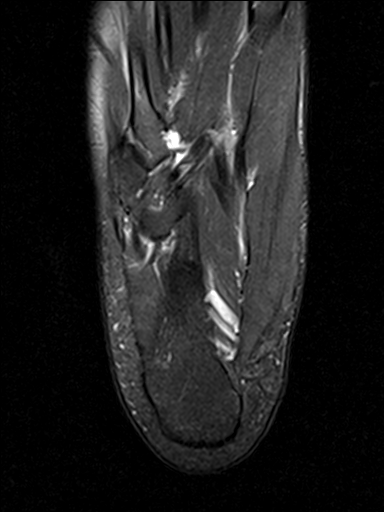
[im 12/24]
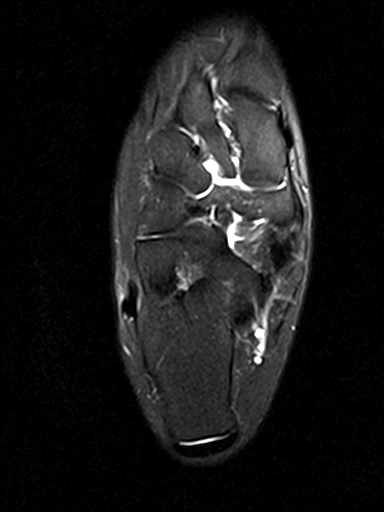
[im 16/24]
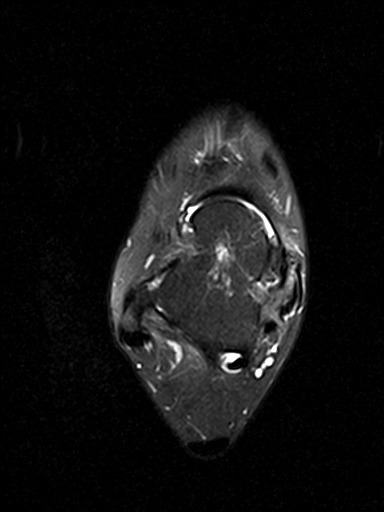
[im 20/24]
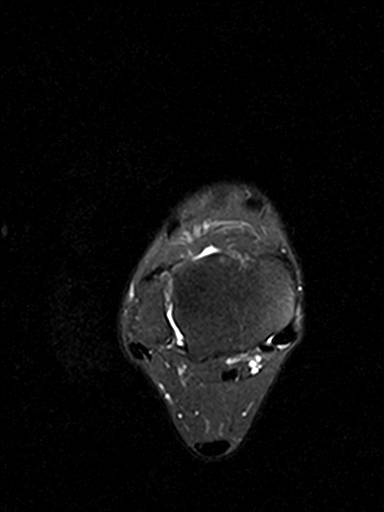
[im 24/24]
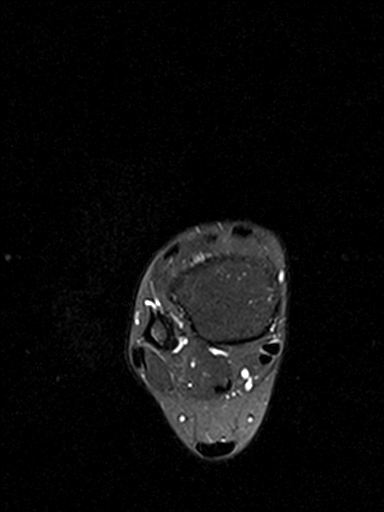

[Series 6: T2 fat-sat · sagittal · 3.0mm · 0.31mm/px · 6 of 22 slices shown (2 of 3)]
[im 1/22]
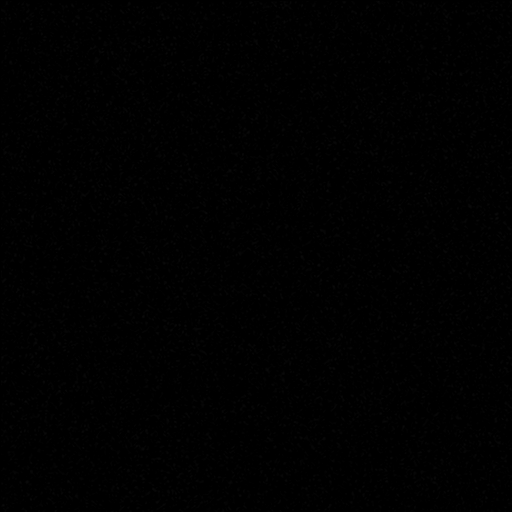
[im 5/22]
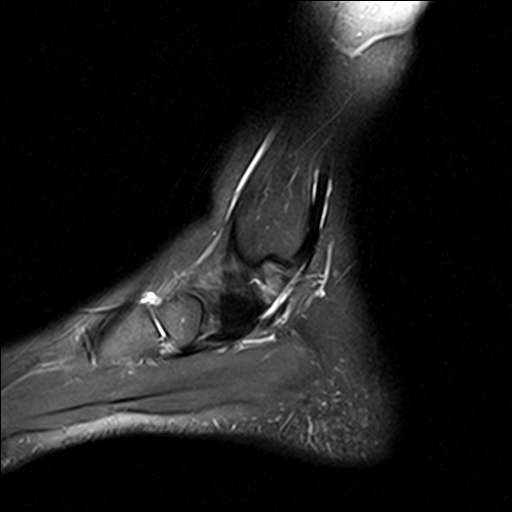
[im 9/22]
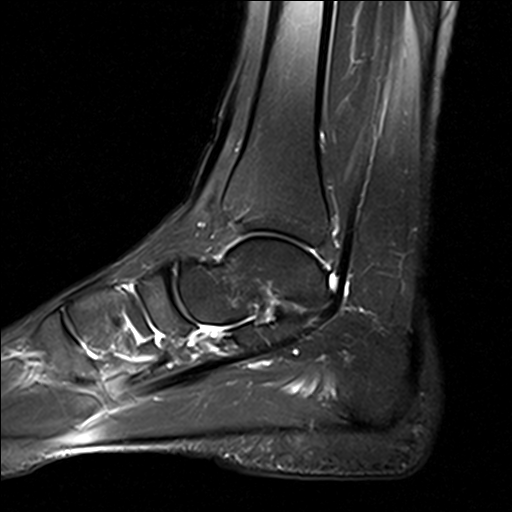
[im 13/22]
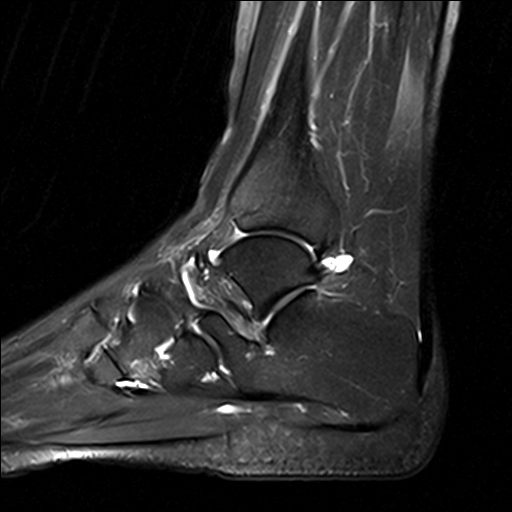
[im 17/22]
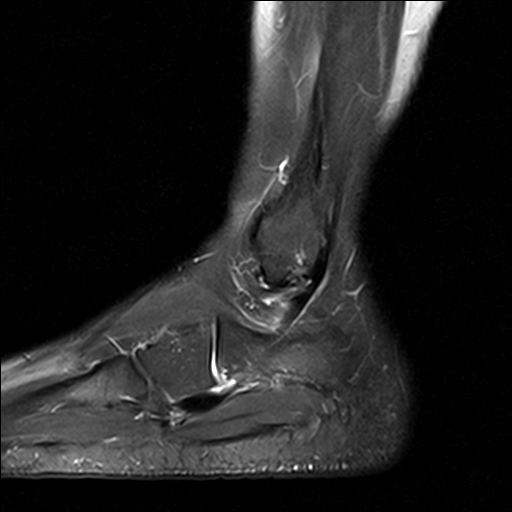
[im 22/22]
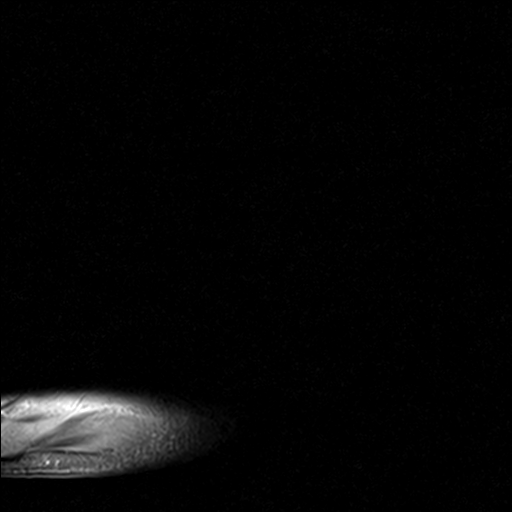

[Series 7: T2 fat-sat · coronal · 4.0mm · 0.31mm/px · 3 of 30 slices shown (3 of 3)]
[im 5/30]
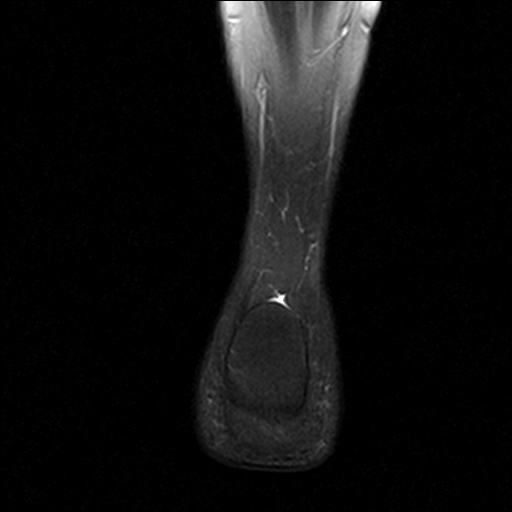
[im 17/30]
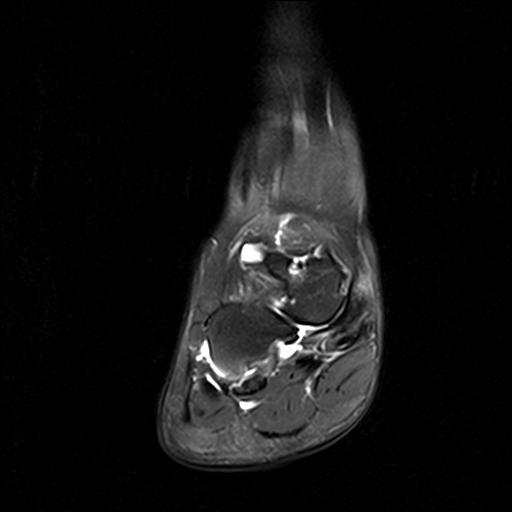
[im 25/30]
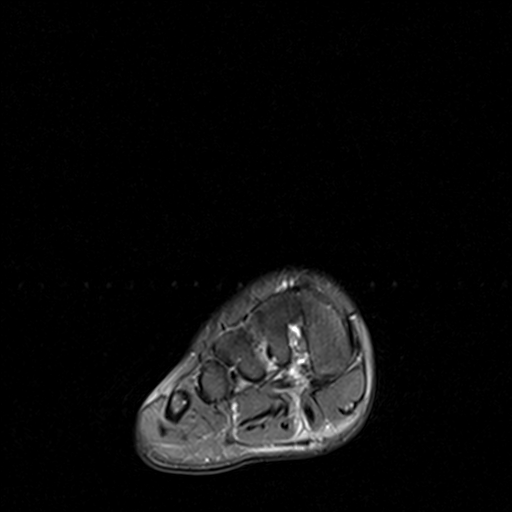

[22 of 40 positions shown; findings below may reference images not displayed]

FINDINGS: TENDONS

Peroneal: Peroneal longus tendon intact. Peroneal brevis intact.

Posteromedial: Posterior tibial tendon intact. Flexor hallucis
longus tendon intact. Flexor digitorum longus tendon intact.

Anterior: Tibialis anterior tendon intact. Extensor hallucis longus
tendon intact Extensor digitorum longus tendon intact.

Achilles: Intact. Trace amount of fluid in the retrocalcaneal bursa.

Plantar Fascia: Intact.

LIGAMENTS

Lateral: Anterior talofibular ligament intact. Calcaneofibular
ligament intact. Posterior talofibular ligament intact. Anterior and
posterior tibiofibular ligaments intact.

Medial: Deltoid ligament intact. Spring ligament intact.

CARTILAGE

Ankle Joint: No joint effusion. Normal ankle mortise. No chondral
defect.

Subtalar Joints/Sinus Tarsi: Normal subtalar joints. No subtalar
joint effusion. Normal sinus tarsi.

Bones: Mild subcortical reactive marrow changes of the dorsal neck
of the talus. Mild osteoarthritis of the talonavicular joint.

Soft Tissue: No fluid collection or hematoma.
IMPRESSION: 1. Mild osteoarthritis of the talonavicular joint.
2. Mild subcortical reactive marrow changes of the dorsal neck of
the talus.

## 2021-04-06 ENCOUNTER — Encounter (HOSPITAL_COMMUNITY): Payer: Self-pay | Admitting: Emergency Medicine

## 2021-04-06 ENCOUNTER — Emergency Department (HOSPITAL_COMMUNITY)
Admission: EM | Admit: 2021-04-06 | Discharge: 2021-04-07 | Disposition: A | Discharge status: Home / Self Care | Payer: Self-pay | Attending: Emergency Medicine | Admitting: Emergency Medicine

## 2021-04-06 ENCOUNTER — Other Ambulatory Visit: Payer: Self-pay

## 2021-04-06 DIAGNOSIS — L509 Urticaria, unspecified: Secondary | ICD-10-CM | POA: Insufficient documentation

## 2021-04-06 MED ORDER — DIPHENHYDRAMINE HCL 25 MG PO CAPS
25.0000 mg | ORAL_CAPSULE | Freq: Once | ORAL | Status: AC
  Administered 2021-04-06: 25 mg via ORAL
  Filled 2021-04-06: qty 1

## 2021-04-06 MED ORDER — PREDNISONE 20 MG PO TABS: ORAL_TABLET | ORAL | 0 refills | Status: DC

## 2021-04-06 MED ORDER — METHYLPREDNISOLONE SODIUM SUCC 125 MG IJ SOLR
125.0000 mg | Freq: Once | INTRAMUSCULAR | Status: AC
  Administered 2021-04-06: 125 mg via INTRAMUSCULAR
  Filled 2021-04-06: qty 2

## 2021-04-06 MED ORDER — DIPHENHYDRAMINE HCL 25 MG PO TABS: 25.0000 mg | ORAL_TABLET | Freq: Four times a day (QID) | ORAL | 0 refills | Status: DC | PRN

## 2021-04-06 NOTE — ED Notes (Signed)
Pt ambulatory in ED lobby. 

## 2021-04-06 NOTE — ED Triage Notes (Signed)
Patient is complaining of allergic reaction that happens every night. Patient states she gets welps and swollen lip. This has been happening for a week.

## 2021-04-07 NOTE — ED Provider Notes (Signed)
[facility]-EMERGENCY DEPT Provider Note   CSN: [id number] Arrival date & time: 04/06/21  2154     History Chief Complaint  Patient presents with   Allergic Reaction    [name] is a 40 y.o. female.  Use Spanish interpreter, currently every night the patient has hives that come up after she was to bed.  Patient states the itching go with her Benadryl but generally improved felt the night and throughout the day she does not have any problems.  She denies any change in her bedding, soaps, detergents or any other easily identifiable triggers.  No history of allergies anything else.  No shortness of breath, nausea, vomiting, syncope or lightheadedness.  The history is provided by the patient.  Allergic Reaction Presenting symptoms: itching and rash       Past Medical History:  Diagnosis Date   Infection    UTI    There are no problems to display for this patient.   Past Surgical History:  Procedure Laterality Date   CHOLECYSTECTOMY     OOPHORECTOMY       OB History     Gravida  2   Para  2   Term  0   Preterm  2   AB      Living         SAB      IAB      Ectopic      Multiple      Live Births              Family History  Problem Relation Age of Onset   Hypertension Brother    Heart disease Brother     Social History   Tobacco Use   Smoking status: Never   Smokeless tobacco: Never  Substance Use Topics   Alcohol use: No   Drug use: No    Home Medications Prior to Admission medications   Medication Sig Start Date End Date Taking? Authorizing Provider  diphenhydrAMINE (BENADRYL) 25 MG tablet Take 1 tablet (25 mg total) by mouth every 6 (six) hours as needed. 04/06/21  Yes Rameen Gohlke, Barbara Cower, MD  predniSONE (DELTASONE) 20 MG tablet 3 tabs po daily x 3 days, then 2 tabs x 3 days, then 1.5 tabs x 3 days, then 1 tab x 3 days, then 0.5 tabs x 3 days 04/06/21  Yes Novella Abraha, Barbara Cower, MD  acetaminophen (TYLENOL) 500 MG  tablet Take 1,000 mg by mouth every 6 (six) hours as needed for mild pain.    [provider]    Allergies    Patient has no known allergies.  Review of Systems   Review of Systems  Skin:  Positive for itching and rash.  All other systems reviewed and are negative.  Physical Exam Updated Vital Signs BP 118/69 (BP Location: Right Arm)   Pulse 76   Temp 98.5 F (36.9 C) (Oral)   Resp 16   Ht 4\' 10"  (1.473 m)   Wt 67.9 kg   LMP 04/05/2021   SpO2 99%   BMI 31.31 kg/m   Physical Exam Vitals and nursing note reviewed.  Constitutional:      Appearance: She is well-developed.  HENT:     Head: Normocephalic and atraumatic.     Mouth/Throat:     Mouth: Mucous membranes are moist.     Pharynx: Oropharynx is clear.  Eyes:     Pupils: Pupils are equal, round, and reactive to light.  Cardiovascular:  Rate and Rhythm: Normal rate and regular rhythm.  Pulmonary:     Effort: No respiratory distress.     Breath sounds: No stridor.  Abdominal:     General: Abdomen is flat. There is no distension.  Musculoskeletal:     Cervical back: Normal range of motion.  Skin:    General: Skin is warm and dry.     Findings: Rash (diffuse urticarial rash) present.  Neurological:     General: No focal deficit present.     Mental Status: She is alert.    ED Results / Procedures / Treatments   Labs (all labs ordered are listed, but only abnormal results are displayed) Labs Reviewed - No data to display  EKG None  Radiology No results found.  Procedures Procedures   Medications Ordered in ED Medications  methylPREDNISolone sodium succinate (SOLU-MEDROL) 125 mg/2 mL injection 125 mg (125 mg Intramuscular Given 04/06/21 2325)  diphenhydrAMINE (BENADRYL) capsule 25 mg (25 mg Oral Given 04/06/21 2325)    ED Course  I have reviewed the triage vital signs and the nursing notes.  Pertinent labs & imaging results that were available during my care of the patient were reviewed  by me and considered in my medical decision making (see chart for details).    MDM Rules/Calculators/A&P                           Urticaria of undetermined origin. No obvious anaphylaxis. Will treat with pred taper and benadryl PRN.   Final Clinical Impression(s) / ED Diagnoses Final diagnoses:  Hives    Rx / DC Orders ED Discharge Orders          Ordered    predniSONE (DELTASONE) 20 MG tablet        04/06/21 2358    diphenhydrAMINE (BENADRYL) 25 MG tablet  Every 6 hours PRN        04/06/21 2358             Jahnavi Muratore, Barbara Cower, MD 04/07/21 0003

## 2021-05-26 NOTE — Progress Notes (Signed)
NEW PATIENT Date of Service/Encounter:  05/27/21 Referring provider: none-self referred Primary care provider: Pcp, No  Subjective:  [name] is a 40 y.o. female presenting today for evaluation of allergic reaction. History obtained from: chart review and patient.  Multiple episodes of Rash: started several months ago as he is greater than 2), occurring almost daily Associates pruritus as well as some angioedema of lips and face in the setting of hives Denies other systemic symptoms including no respiratory, gastrointestinal or cardiovascular distress. No changes to personal care products, detergents, etc Therapies tried: Steroid Dosepak, Benadryl, and cetirizine Potential triggers: None identified Does not associate fever, joint pain, joint swelling, weight loss.  Lesions resolve in 24 to 48 hours No bruising on resolution. No recent illness. Pictures reviewed and consistent with hives with lip angioedema  ED visit 04/06/21-recurrent nighttime hives improved with Benadryl no obvious trigger treated with Methylpred and Benadryl  Other allergy screening: Asthma: no Rhino conjunctivitis: no Food allergy: no Medication allergy: no Hymenoptera allergy: no Eczema:no History of recurrent infections suggestive of immunodeficency: no Vaccinations are up to date.   Past Medical History: Past Medical History:  Diagnosis Date   Infection    UTI   Medication List:  Current Outpatient Medications  Medication Sig Dispense Refill   acetaminophen (TYLENOL) 500 MG tablet Take 1,000 mg by mouth every 6 (six) hours as needed for mild pain.     diphenhydrAMINE (BENADRYL) 25 MG tablet Take 1 tablet (25 mg total) by mouth every 6 (six) hours as needed. 30 tablet 0   predniSONE (DELTASONE) 20 MG tablet 3 tabs po daily x 3 days, then 2 tabs x 3 days, then 1.5 tabs x 3 days, then 1 tab x 3 days, then 0.5 tabs x 3 days 27 tablet 0   No current facility-administered medications  for this visit.   Known Allergies:  No Known Allergies Past Surgical History: Past Surgical History:  Procedure Laterality Date   CHOLECYSTECTOMY     OOPHORECTOMY     Family History: Family History  Problem Relation Age of Onset   Hypertension Brother    Heart disease Brother    Social History: Malcolm lives in a house built 2 years ago, carpet in the bedroom, electric heating, central AC, no pets, no visible pests, no dust mite protection on bedding, no smoke exposure, denies personal smoking history..   ROS:  All other systems negative except as noted per HPI.  Objective:  Blood pressure 110/70, pulse 76, temperature 98.7 F (37.1 C), temperature source Temporal, resp. rate 16, height _0  (1.473 m), weight 147 lb 3.2 oz (66.8 kg), SpO2 98 %. Body mass index is 30.76 kg/m. Physical Exam:  General Appearance:  Alert, cooperative, no distress, appears stated age  Head:  Normocephalic, without obvious abnormality, atraumatic  Eyes:  Conjunctiva clear, EOM's intact  Nose: Nares normal right hypertrophic turbinate, normal mucosa  Throat: Lips, tongue normal; teeth and gums normal, normal posterior oropharynx  Neck: Supple, symmetrical  Lungs:   Clear to auscultation bilaterally, respirations unlabored, no coughing  Heart:  Regular rate and rhythm, no murmur appears well perfused  Extremities: No edema  Skin: Skin color, texture, turgor normal, no rashes or lesions on visualized portions of skin  Neurologic: No gross deficits   Diagnostics: - serum testing as below Assessment:  Chronic idiopathic urticaria - Plan: Alpha-Gal Panel, Thyroid antibodies, Tryptase, CBC with Differential/Platelet, CMP14+EGFR  In this case, without any obvious triggers, history most consistent with chronic idiopathic  urticaria which is an intrinsic process and  typically not resultant of an underlying allergy.  Considered food/medication allergies, but these are usually obvious, occur within 2  hours of consumption, and resolve within 24 hours.  They also recur with every subsequent exposure.  We also discussed that environmental allergies rarely cause urticaria, and again, trigger is usually obvious.  Other considerations unlikely based on history include vasculitic urticaria or autoimmune disease, but given lack of associated symptoms/signs, no low concern for these.  Will obtain labs for reassurance purposes.  Plan/Recommendations:   Patient Instructions  Chronic Idiopathic Urticaria: - this is defined as hives lasting more than 6 weeks without an identifiable trigger - hives can be from a number of different sources including infections, allergies, vibration, temperature, pressure among many others other possible causes - often an identifiable cause is not determined - some potential triggers include: stress, illness, NSAIDs, aspirin, hormonal changes - you do not have any red flag symptoms to make [place] concerned about secondary causes of hives, but we will screen for these for reassurance with: CBC w diff, CMP, tryptase, TSH, alpha-gal panel - approximately 50% of patients with chronic hives can have some associated swelling of the face/lips/eyelids (this is not a cause for alarm and does not typically progress onto systemic allergic reactions)  Therapy Plan:  - continue zyrtec (cetirizine) 76m once daily - if hives are uncontrolled, increase zyrtec (cetirizine) to 146mtwice daily - if hives remain uncontrolled, increase dose of zyrtec (cetirizine) to max dose of 2044m2 pills) twice daily- this is maximum dose - can increase or decrease dosing depending on symptom control to a maximum dose of 4 tablets of antihistamine daily. Wait until hives free for at least one month prior to decreasing dose.   - if hives are still uncontrolled with the above regimen, please arrange an appointment for discussion of Xolair (omalizumab)- an injectable medication for hives  Can use one of the  following in place of zyrtec if desires: Claritin (loratadine) 10 mg, Xyzal (levocetirizine) 5 mg or Allegra (fexofenadine) 180 mg daily as needed   Follow-up in 3 months  Return in about 3 months (around 08/27/2021).  This note in its entirety was forwarded to the Provider who requested this consultation.  Thank you for your kind referral. I appreciate the opportunity to take part in Raja's care. Please do not hesitate to contact me with questions.  Sincerely,  [facility]

## 2021-05-27 ENCOUNTER — Encounter: Payer: Self-pay | Admitting: Internal Medicine

## 2021-05-27 ENCOUNTER — Other Ambulatory Visit: Payer: Self-pay

## 2021-05-27 ENCOUNTER — Ambulatory Visit (INDEPENDENT_AMBULATORY_CARE_PROVIDER_SITE_OTHER): Payer: Self-pay | Admitting: Internal Medicine

## 2021-05-27 VITALS — BP 110/70 | HR 76 | Temp 98.7°F | Resp 16 | Ht <= 58 in | Wt 147.2 lb

## 2021-05-27 DIAGNOSIS — L501 Idiopathic urticaria: Secondary | ICD-10-CM

## 2021-05-27 MED ORDER — CETIRIZINE HCL 10 MG PO TABS: ORAL_TABLET | ORAL | 3 refills | Status: AC

## 2021-05-27 NOTE — Patient Instructions (Addendum)
Urticaria idioptica crnica: - esto se define como urticaria que dura ms de 6 semanas sin un desencadenante identificable - la urticaria puede provenir de varias fuentes diferentes, incluidas infecciones, alergias, vibraciones, temperatura, presin, entre [employer] causas - a menudo no se determina una causa identificable - algunos desencadenantes potenciales incluyen: estrs, enfermedad, AINE, aspirina, cambios hormonales - no tiene ningn sntoma de alerta para que nos preocupemos por las causas secundarias de la urticaria, pero los evaluaremos para tranquilizarnos con: CBC w diff, CMP, triptasa, TSH, panel alfa-gal - aproximadamente el 50 % de los pacientes con urticaria crnica pueden tener alguna hinchazn asociada de la cara/labios/prpados (esto no es motivo de alarma y normalmente no progresa a Therapist, art sistmicas)  Plan de [place]: - contine con zyrtec (cetirizina) 10 mg una vez al da - si la urticaria no est controlada, aumente zyrtec (cetirizina) a 10 mg dos veces al da - si la urticaria no se controla, aumente la dosis de zyrtec (cetirizina) a la dosis mxima de 20 mg (2 pastillas) dos veces al da; esta es la dosis mxima - puede aumentar o disminuir la dosis dependiendo del control de los sntomas hasta una dosis mxima de 4 comprimidos de antihistamnico al Futures trader. Espere hasta que la urticaria desaparezca durante al menos un mes antes de disminuir la dosis. - si la urticaria an no se controla con el rgimen anterior, programe una cita para hablar sobre Xolair (omalizumab), un medicamento inyectable para la urticaria  Puede usar uno de los siguientes en lugar de zyrtec si lo desea: Claritin (loratadina) 10 mg, Xyzal (levocetirizina) 5 mg o Allegra (fexofenadina) 180 mg diariamente segn sea necesario  Chronic Idiopathic Urticaria: - this is defined as hives lasting more than 6 weeks without an identifiable trigger - hives can be from a number of different  sources including infections, allergies, vibration, temperature, pressure among many others other possible causes - often an identifiable cause is not determined - some potential triggers include: stress, illness, NSAIDs, aspirin, hormonal changes - you do not have any red flag symptoms to make [place] concerned about secondary causes of hives, but we will screen for these for reassurance with: CBC w diff, CMP, tryptase, TSH, alpha-gal panel - approximately 50% of patients with chronic hives can have some associated swelling of the face/lips/eyelids (this is not a cause for alarm and does not typically progress onto systemic allergic reactions)  Therapy Plan:  - continue zyrtec (cetirizine) 10mg  once daily - if hives are uncontrolled, increase zyrtec (cetirizine) to 10mg  twice daily - if hives remain uncontrolled, increase dose of zyrtec (cetirizine) to max dose of 20mg  (2 pills) twice daily- this is maximum dose - can increase or decrease dosing depending on symptom control to a maximum dose of 4 tablets of antihistamine daily. Wait until hives free for at least one month prior to decreasing dose.   - if hives are still uncontrolled with the above regimen, please arrange an appointment for discussion of Xolair (omalizumab)- an injectable medication for hives  Can use one of the following in place of zyrtec if desires: Claritin (loratadine) 10 mg, Xyzal (levocetirizine) 5 mg or Allegra (fexofenadine) 180 mg daily as needed   Follow-up in 3 months

## 2021-05-29 LAB — CMP14+EGFR
ALT: 20 IU/L (ref 0–32)
AST: 20 IU/L (ref 0–40)
Albumin/Globulin Ratio: 1.8 (ref 1.2–2.2)
Albumin: 4.6 g/dL (ref 3.8–4.8)
Alkaline Phosphatase: 96 IU/L (ref 44–121)
BUN/Creatinine Ratio: 9 (ref 9–23)
BUN: 6 mg/dL (ref 6–24)
Bilirubin Total: 0.5 mg/dL (ref 0.0–1.2)
CO2: 19 mmol/L — ABNORMAL LOW (ref 20–29)
Calcium: 9.2 mg/dL (ref 8.7–10.2)
Chloride: 106 mmol/L (ref 96–106)
Creatinine, Ser: 0.66 mg/dL (ref 0.57–1.00)
Globulin, Total: 2.6 g/dL (ref 1.5–4.5)
Glucose: 93 mg/dL (ref 70–99)
Potassium: 4.7 mmol/L (ref 3.5–5.2)
Sodium: 140 mmol/L (ref 134–144)
Total Protein: 7.2 g/dL (ref 6.0–8.5)
eGFR: 112 mL/min/{1.73_m2} (ref >59)

## 2021-05-29 LAB — TRYPTASE: Tryptase: 5.6 ug/L (ref 2.2–13.2)

## 2021-05-29 LAB — CBC WITH DIFFERENTIAL/PLATELET
Basophils Absolute: 0 10*3/uL (ref 0.0–0.2)
Basos: 0 %
EOS (ABSOLUTE): 0.1 10*3/uL (ref 0.0–0.4)
Eos: 1 %
Hematocrit: 43.6 % (ref 34.0–46.6)
Hemoglobin: 14.5 g/dL (ref 11.1–15.9)
Immature Grans (Abs): 0 10*3/uL (ref 0.0–0.1)
Immature Granulocytes: 0 %
Lymphocytes Absolute: 2.6 10*3/uL (ref 0.7–3.1)
Lymphs: 35 %
MCH: 28.8 pg (ref 26.6–33.0)
MCHC: 33.3 g/dL (ref 31.5–35.7)
MCV: 87 fL (ref 79–97)
Monocytes Absolute: 0.5 10*3/uL (ref 0.1–0.9)
Monocytes: 7 %
Neutrophils Absolute: 4.1 10*3/uL (ref 1.4–7.0)
Neutrophils: 57 %
Platelets: 275 10*3/uL (ref 150–450)
RBC: 5.03 x10E6/uL (ref 3.77–5.28)
RDW: 13.3 % (ref 11.7–15.4)
WBC: 7.3 10*3/uL (ref 3.4–10.8)

## 2021-05-29 LAB — THYROID ANTIBODIES
Thyroglobulin Antibody: <1 IU/mL (ref 0.0–0.9)
Thyroperoxidase Ab SerPl-aCnc: 13 IU/mL (ref 0–34)

## 2021-06-04 LAB — ALPHA-GAL PANEL
Allergen Lamb IgE: <0.1 kU/L
Beef IgE: <0.1 kU/L
IgE (Immunoglobulin E), Serum: 106 IU/mL (ref 6–495)
O215-IgE Alpha-Gal: <0.1 kU/L
Pork IgE: <0.1 kU/L

## 2021-06-04 NOTE — Progress Notes (Signed)
Please let [name] know that her labs are back and are reassuring.  She should continue the therapy plan that we discussed, and we will see her in follow-up in 3 months.

## 2021-07-10 ENCOUNTER — Telehealth: Payer: Self-pay | Admitting: *Deleted

## 2021-07-10 NOTE — Telephone Encounter (Signed)
Pt niece called and wanted to know lab results. Verbally given. States pt has not had any reactions since her office visit since starting medications. Advised her to schedule an appt for 3 month follow up. She stated she would have pt call back to schedule.

## 2021-09-09 ENCOUNTER — Ambulatory Visit: Payer: Self-pay | Admitting: Internal Medicine

## 2021-09-29 NOTE — Progress Notes (Deleted)
FOLLOW UP Date of Service/Encounter:  09/29/21   Subjective:  [name] (DOB: 1980-10-26) is a 41 y.o. female [facility] in re-evaluation of the following: chronic idiopathic urticaria History obtained from: chart review and {Persons; PED relatives w/patient:19415::"patient"}.  For Review, LV was on 05/27/21  with Dr.Trevonn Hallum.  We started zyrtec 10 mg daily and dicsussed increasing as need to maximum dose of 4 tablets to control hives.  Pertinent history:  - hives labs 05/27/21-negative alpha-gal, normal baseline tryptase 5.6, negative thyroid antibodies, reassuring CBCd and CMP  Allergies as of 09/30/2021   No Known Allergies      Medication List        Accurate as of September 29, 2021 12:54 PM. If you have any questions, ask your nurse or doctor.          acetaminophen 500 MG tablet Commonly known as: TYLENOL Take 1,000 mg by mouth every 6 (six) hours as needed for mild pain.   cetirizine 10 MG tablet Commonly known as: ZyrTEC Allergy May take maximum of 2 tablets in the morning and 2 tablets in the evening for hives.  Do not combine with other antihistamines.   diphenhydrAMINE 25 MG tablet Commonly known as: BENADRYL Take 1 tablet (25 mg total) by mouth every 6 (six) hours as needed.   predniSONE 20 MG tablet Commonly known as: DELTASONE 3 tabs po daily x 3 days, then 2 tabs x 3 days, then 1.5 tabs x 3 days, then 1 tab x 3 days, then 0.5 tabs x 3 days       Past Medical History:  Diagnosis Date   Infection    UTI   Past Surgical History:  Procedure Laterality Date   CHOLECYSTECTOMY     OOPHORECTOMY     Otherwise, there have been no changes to her past medical history, surgical history, family history, or social history.  ROS: All others negative except as noted per HPI.   Objective:  There were no vitals taken for this visit. There is no height or weight on file to calculate BMI. Physical  Exam: General Appearance:  Alert, cooperative, no distress, appears stated age  Head:  Normocephalic, without obvious abnormality, atraumatic  Eyes:  Conjunctiva clear, EOM's intact  Nose: Nares normal, {Blank multiple:19196:a:"***","hypertrophic turbinates","normal mucosa","no visible anterior polyps","septum midline"}  Throat: Lips, tongue normal; teeth and gums normal, {Blank multiple:19196:a:"***","normal posterior oropharynx","tonsils 2+","tonsils 3+","no tonsillar exudate","+ cobblestoning"}  Neck: Supple, symmetrical  Lungs:   {Blank multiple:19196:a:"***","clear to auscultation bilaterally","end-expiratory wheezing","wheezing throughout"}, Respirations unlabored, {Blank multiple:19196:a:"***","no coughing","intermittent dry coughing"}  Heart:  {Blank multiple:19196:a:"***","regular rate and rhythm","no murmur"}, Appears well perfused  Extremities: No edema  Skin: Skin color, texture, turgor normal, no rashes or lesions on visualized portions of skin  Neurologic: No gross deficits   Reviewed: ***  Spirometry:  Tracings reviewed. Her effort: {Blank single:19197::"Good reproducible efforts.","It was hard to get consistent efforts and there is a question as to whether this reflects a maximal maneuver.","Poor effort, data can not be interpreted.","Variable effort-results affected.","decent for first attempt at spirometry."} FVC: ***L FEV1: ***L, ***% predicted FEV1/FVC ratio: ***% Interpretation: {Blank single:19197::"Spirometry consistent with mild obstructive disease","Spirometry consistent with moderate obstructive disease","Spirometry consistent with severe obstructive disease","Spirometry consistent with possible restrictive disease","Spirometry consistent with mixed obstructive and restrictive disease","Spirometry uninterpretable due to technique","Spirometry consistent with normal pattern","No overt abnormalities noted given today's efforts"}.  Please see scanned spirometry results for  details.  Skin Testing: {Blank single:19197::"Select foods","Environmental allergy panel","Environmental allergy panel  and select foods","Food allergy panel","None","Deferred due to recent antihistamines use"}. Positive test to: ***. Negative test to: ***.  Results discussed with patient/family.   {Blank single:19197::"Allergy testing results were read and interpreted by myself, documented by clinical staff."," "}  Assessment/Plan  There are no Patient Instructions on file for this visit.  Tonny Bollman, MD  [facility]

## 2021-09-30 ENCOUNTER — Ambulatory Visit: Payer: Self-pay | Admitting: Internal Medicine

## 2021-09-30 DIAGNOSIS — J309 Allergic rhinitis, unspecified: Secondary | ICD-10-CM

## 2023-06-07 ENCOUNTER — Encounter: Payer: Self-pay | Admitting: Certified Nurse Midwife

## 2023-06-07 ENCOUNTER — Other Ambulatory Visit: Payer: Self-pay

## 2023-06-07 DIAGNOSIS — Z1231 Encounter for screening mammogram for malignant neoplasm of breast: Secondary | ICD-10-CM

## 2023-08-11 ENCOUNTER — Ambulatory Visit: Payer: Self-pay | Admitting: Hematology and Oncology

## 2023-08-11 ENCOUNTER — Ambulatory Visit
Admission: RE | Admit: 2023-08-11 | Discharge: 2023-08-11 | Disposition: A | Discharge status: Home / Self Care | Payer: No Typology Code available for payment source | Source: Ambulatory Visit | Attending: Obstetrics and Gynecology | Admitting: Obstetrics and Gynecology

## 2023-08-11 ENCOUNTER — Encounter: Payer: Self-pay | Admitting: Obstetrics and Gynecology

## 2023-08-11 VITALS — BP 111/77 | Wt 150.0 lb

## 2023-08-11 DIAGNOSIS — Z1231 Encounter for screening mammogram for malignant neoplasm of breast: Secondary | ICD-10-CM

## 2023-08-11 NOTE — Patient Instructions (Signed)
Taught [name] about self breast awareness and gave educational materials to take home. Patient did not need a Pap smear today due to last Pap smear was in 06/16/2022 per patient. Let her know [facility] will cover Pap smears every 5 years unless has a history of abnormal Pap smears. Referred patient to the [facility] for screening mammogram. Appointment scheduled for 08/11/2023. Patient aware of appointment and will be there. Let patient know will follow up with her within the next couple weeks with results. [name] verbalized understanding.  Pascal Lux, NP 9:50 AM

## 2023-08-11 NOTE — Progress Notes (Signed)
[name] is a 42 y.o. female who presents to [facility] clinic today with no complaints.    Pap Smear: Pap not smear completed today. Last Pap smear was 06/16/2022 and was normal. Per patient has no history of an abnormal Pap smear. Last Pap smear result is available in Epic.   Physical exam: Breasts Breasts symmetrical. No skin abnormalities bilateral breasts. No nipple retraction bilateral breasts. No nipple discharge bilateral breasts. No lymphadenopathy. No lumps palpated bilateral breasts.     Pelvic/Bimanual Pap is not indicated today    Smoking History: Patient has never smoked and was not referred to quit line.    Patient Navigation: Patient education provided. Access to services provided for patient through [facility] program. [name] interpreter provided. No transportation provided   Colorectal Cancer Screening: Per patient has never had colonoscopy completed No complaints today.    Breast and Cervical Cancer Risk Assessment: Patient does not have family history of breast cancer, known genetic mutations, or radiation treatment to the chest before age 58. Patient does not have history of cervical dysplasia, immunocompromised, or DES exposure in-utero.  Risk Scores as of Encounter on 08/11/2023     [name]           5-year 0.23%   Lifetime 3.5%            Last calculated by [name], CMA on 08/11/2023 at  9:26 AM        A: [facility] exam without pap smear No complaints with benign exam.   P: Referred patient to the [facility] for a screening mammogram. Appointment scheduled 08/11/2023.  Gina Lux, NP 08/11/2023 9:47 AM

## 2023-08-18 ENCOUNTER — Other Ambulatory Visit: Payer: Self-pay | Admitting: Obstetrics and Gynecology

## 2023-08-18 DIAGNOSIS — R928 Other abnormal and inconclusive findings on diagnostic imaging of breast: Secondary | ICD-10-CM

## 2023-09-23 ENCOUNTER — Ambulatory Visit
Admission: RE | Admit: 2023-09-23 | Discharge: 2023-09-23 | Disposition: A | Discharge status: Home / Self Care | Payer: No Typology Code available for payment source | Source: Ambulatory Visit | Attending: Obstetrics and Gynecology | Admitting: Obstetrics and Gynecology

## 2023-09-23 DIAGNOSIS — R928 Other abnormal and inconclusive findings on diagnostic imaging of breast: Secondary | ICD-10-CM

## 2024-06-27 ENCOUNTER — Telehealth: Payer: Self-pay

## 2024-06-27 NOTE — Telephone Encounter (Signed)
 Attempted to contact patient regarding bilateral breast pain. Left message on voicemail requesting a return call.

## 2024-06-28 ENCOUNTER — Telehealth: Payer: Self-pay

## 2024-06-28 ENCOUNTER — Other Ambulatory Visit: Payer: Self-pay

## 2024-06-28 DIAGNOSIS — N6310 Unspecified lump in the right breast, unspecified quadrant: Secondary | ICD-10-CM

## 2024-06-28 DIAGNOSIS — N644 Mastodynia: Secondary | ICD-10-CM

## 2024-06-28 NOTE — Telephone Encounter (Addendum)
 Patient returned call. Via Alan Been, [facility] Spanish Interpreter, patient stated she is having breast pain in both breasts, increased pain in right breast. Also, feels new, hard lump in Right breast when sitting and lying down. Patient states pain is constant and is a 8-9 out of 10. Patient denies discharge. Patient stated she was told at mammogram in 08/2023 that she has fluid in her breasts, but not to be concerned, unsure what this means. Patient informed has benign cysts in both breasts. Patient informed cysts are usually benign, can cause some pain/tenderness prior to menstrual cycle, provided reassurance.  Patient denies any breast discharge. Patient informed needs to be evaluated in [facility] clinic, Scheduler will call her, verbalized understanding.        Via, Alan Been, [facility] Spanish Interpreter, left message on voicemail requesting return. Patient left message with scheduler on 06/27/2024 requesting return call regarding bilateral breast pain.

## 2024-06-28 NOTE — Telephone Encounter (Signed)
 Error

## 2024-08-07 ENCOUNTER — Ambulatory Visit

## 2024-08-07 ENCOUNTER — Other Ambulatory Visit

## 2024-08-07 ENCOUNTER — Ambulatory Visit: Payer: Self-pay | Admitting: *Deleted

## 2024-08-07 ENCOUNTER — Encounter

## 2024-08-07 ENCOUNTER — Ambulatory Visit: Payer: Self-pay | Admitting: Obstetrics & Gynecology

## 2024-08-07 ENCOUNTER — Ambulatory Visit
Admission: RE | Admit: 2024-08-07 | Discharge: 2024-08-07 | Disposition: A | Source: Ambulatory Visit | Attending: Obstetrics and Gynecology | Admitting: Obstetrics and Gynecology

## 2024-08-07 VITALS — Wt 145.0 lb

## 2024-08-07 DIAGNOSIS — N644 Mastodynia: Secondary | ICD-10-CM

## 2024-08-07 DIAGNOSIS — Z1211 Encounter for screening for malignant neoplasm of colon: Secondary | ICD-10-CM

## 2024-08-07 DIAGNOSIS — Z1239 Encounter for other screening for malignant neoplasm of breast: Secondary | ICD-10-CM

## 2024-08-07 DIAGNOSIS — N6310 Unspecified lump in the right breast, unspecified quadrant: Secondary | ICD-10-CM

## 2024-08-07 NOTE — Patient Instructions (Signed)
 Explained breast self awareness with [name] how to perform BSE and gave educational materials to take home. Patient did not need a Pap smear today due to last Pap smear and HPV typing was 06/16/2022. Let her know [facility] will cover Pap smears and HPV typing every 5 years unless has a history of abnormal Pap smears. Referred patient to the [facility] of [facility] for a diagnostic mammogram. Appointment scheduled Tuesday, August 07, 2024 at 1310. Patient aware of appointment and will be there. [name] verbalized understanding.  Sandrea Boer, Wanda Ship, RN 10:10 AM

## 2024-08-07 NOTE — Progress Notes (Signed)
 [name] is a 43 y.o. female who presents to [facility] clinic today with complaint of bilateral outer and lower breast pain since prior to previous mammogram 09/23/2023 that has increased. Patient states the pain and constant. Patient rates the pain at a 6-7 out of 10.   Pap Smear: Pap smear not completed today. Last Pap smear was 06/16/2022 at [facility] clinic and was normal with negative HPV. Per patient has history of an abnormal Pap smear 15-16 years ago that cryotherapy was completed for follow up. Patient stated all Pap smears have been normal since Cryotherapy and she has had at least three normal Pap smears. Last Pap smear result is available in Epic.   Physical exam: Breasts Breasts symmetrical. No skin abnormalities bilateral breasts. No nipple retraction right breast. Observed left nipple inversion that per patient is normal for her. No nipple discharge bilateral breasts. No lymphadenopathy. No lumps palpated bilateral breasts. Complaints of bilateral outer and lower breast pain on exam.     MM 3D DIAGNOSTIC MAMMOGRAM BILATERAL BREAST Addendum Date: 09/26/2023 ADDENDUM REPORT: 09/26/2023 10:40 ADDENDUM: EXAM should read: DIGITAL DIAGNOSTIC BILATERAL MAMMOGRAM WITH TOMOSYNTHESIS AND CAD ULTRASOUND OF BILATERAL BREAST Electronically Signed   By: Reyes Phi M.D.   On: 09/26/2023 10:40   Result Date: 09/26/2023 CLINICAL DATA:  43 year old female presents for further evaluation of mass within each breast identified on baseline screening EXAM: Mammogram. TECHNIQUE: Bilateral digital diagnostic mammography and breast tomosynthesis was performed. The images were evaluated with computer-aided detection. ; Targeted ultrasound examination of the right breast was performed; Targeted ultrasound examination of the left breast was performed. COMPARISON:  Previous exam(s). ACR Breast Density Category c: The breasts are heterogeneously dense, which may obscure small masses. FINDINGS: Spot  compression views of both breasts are performed. No definite persistent suspicious abnormalities noted within the LOWER or. A persistent partially circumscribed partially obscured mass within the OUTER RETROAREOLAR LEFT breast is noted. Targeted ultrasound is performed, showing no sonographic abnormalities within the LOWER RIGHT breast. A 0.8 x 0.4 x 0.8 cm benign cyst at the 9 o'clock position of the RIGHT breast 1 cm from the nipple is noted. A 1.1 x 0.5 x 0.6 cm minimally complicated benign cyst at the 3 o'clock position of the RETROAREOLAR LEFT breast corresponds to the LEFT mammographic mass. IMPRESSION: 1. Benign cysts within both breast. No suspicious findings identified. RECOMMENDATION: Bilateral screening mammogram in 1 year. I have discussed the findings and recommendations with the patient. If applicable, a reminder letter will be sent to the patient regarding the next appointment. BI-RADS CATEGORY  2: Benign. Electronically Signed: By: Reyes Phi M.D. On: 09/23/2023 15:21   MS 3D SCR MAMMO BILAT BR (aka MM) Result Date: 08/15/2023 CLINICAL DATA:  Screening. EXAM: DIGITAL SCREENING BILATERAL MAMMOGRAM WITH TOMOSYNTHESIS AND CAD TECHNIQUE: Bilateral screening digital craniocaudal and mediolateral oblique mammograms were obtained. Bilateral screening digital breast tomosynthesis was performed. The images were evaluated with computer-aided detection. COMPARISON:  None available. ACR Breast Density Category d: The breasts are extremely dense, which lowers the sensitivity of mammography. FINDINGS: In the right breast a possible mass requires further evaluation. In the left breast a possible mass requires further evaluation. IMPRESSION: Further evaluation is suggested for a possible mass in the right breast. Further evaluation is suggested for a possible mass in the left breast. RECOMMENDATION: Diagnostic mammogram and possibly ultrasound of both breasts. (Code:[id number]-64M) The patient will be contacted  regarding the findings, and additional imaging will be scheduled. BI-RADS CATEGORY  0: Incomplete: Need additional imaging evaluation. Electronically Signed   By: Rosaline Collet M.D.   On: 08/15/2023 17:31    Pelvic/Bimanual Pap is not indicated today per BCCCP guidelines.   Smoking History: Patient has never smoked.  Patient Navigation: Patient education provided. Access to services provided for patient through [place] program. Spanish interpreter Bernice Angry from [facility] provided. Patient has food insecurities. Patient escorted to the market at the [facility] for groceries.  Colorectal Cancer Screening: Per patient has never had colonoscopy completed. FIT Test given to patient to complete. No complaints today.    Breast and Cervical Cancer Risk Assessment: Patient does not have family history of breast cancer, known genetic mutations, or radiation treatment to the chest before age 2. Per patient has history of cervical dysplasia. Patient has no history of being immunocompromised or DES exposure in-utero.  Risk Scores as of Encounter on 08/07/2024     Alisa as of 08/11/2023           5-year 0.23%   Lifetime 3.5%            Last calculated by Silas, Ansyi K, CMA on 08/11/2023 at  9:26 AM        A: BCCCP exam without pap smear Complaint of bilateral outer and lower breast pain.  P: [facility] to [facility] for a diagnostic mammogram. Appointment scheduled Tuesday, August 07, 2024 at 1310.  Driscilla Wanda SQUIBB, RN 08/07/2024 10:10 AM
